# Patient Record
Sex: Male | Born: 1955
Health system: Southern US, Community
[De-identification: ages and names within clinical notes are randomized; demographics above are authoritative.]

## PROBLEM LIST (undated history)

## (undated) DIAGNOSIS — E119 Type 2 diabetes mellitus without complications: Secondary | ICD-10-CM

## (undated) DIAGNOSIS — K439 Ventral hernia without obstruction or gangrene: Secondary | ICD-10-CM

## (undated) DIAGNOSIS — I10 Essential (primary) hypertension: Secondary | ICD-10-CM

## (undated) DIAGNOSIS — N201 Calculus of ureter: Secondary | ICD-10-CM

## (undated) DIAGNOSIS — M1990 Unspecified osteoarthritis, unspecified site: Secondary | ICD-10-CM

## (undated) DIAGNOSIS — Z87442 Personal history of urinary calculi: Secondary | ICD-10-CM

## (undated) HISTORY — DX: Type 2 diabetes mellitus without complications: E11.9

## (undated) HISTORY — PX: OTHER SURGICAL HISTORY: SHX169

## (undated) HISTORY — PX: ANKLE FRACTURE SURGERY: SHX122

---

## 2009-12-14 ENCOUNTER — Ambulatory Visit (HOSPITAL_COMMUNITY): Admission: RE | Admit: 2009-12-14 | Discharge: 2009-12-14 | Payer: Self-pay | Admitting: Orthopaedic Surgery

## 2012-08-14 ENCOUNTER — Emergency Department (HOSPITAL_COMMUNITY): Payer: 59

## 2012-08-14 ENCOUNTER — Emergency Department (HOSPITAL_COMMUNITY)
Admission: EM | Admit: 2012-08-14 | Discharge: 2012-08-14 | Disposition: A | Discharge status: Home / Self Care | Payer: 59 | Attending: Emergency Medicine | Admitting: Emergency Medicine

## 2012-08-14 ENCOUNTER — Encounter (HOSPITAL_COMMUNITY): Payer: Self-pay | Admitting: *Deleted

## 2012-08-14 DIAGNOSIS — N201 Calculus of ureter: Secondary | ICD-10-CM | POA: Insufficient documentation

## 2012-08-14 DIAGNOSIS — R112 Nausea with vomiting, unspecified: Secondary | ICD-10-CM | POA: Insufficient documentation

## 2012-08-14 LAB — COMPREHENSIVE METABOLIC PANEL
AST: 32 U/L (ref 0–37)
Albumin: 5 g/dL (ref 3.5–5.2)
Creatinine, Ser: 1.34 mg/dL (ref 0.50–1.35)
GFR calc non Af Amer: 58 mL/min — ABNORMAL LOW (ref >90)
Total Bilirubin: 0.6 mg/dL (ref 0.3–1.2)

## 2012-08-14 LAB — URINALYSIS, ROUTINE W REFLEX MICROSCOPIC
Bilirubin Urine: NEGATIVE
Leukocytes, UA: NEGATIVE
Protein, ur: 30 mg/dL — AB

## 2012-08-14 LAB — CBC WITH DIFFERENTIAL/PLATELET
Eosinophils Absolute: 0 10*3/uL (ref 0.0–0.7)
Hemoglobin: 15.9 g/dL (ref 13.0–17.0)
MCH: 29.5 pg (ref 26.0–34.0)
MCV: 87 fL (ref 78.0–100.0)
Neutro Abs: 11.1 10*3/uL — ABNORMAL HIGH (ref 1.7–7.7)
RDW: 12.8 % (ref 11.5–15.5)

## 2012-08-14 MED ORDER — TAMSULOSIN HCL 0.4 MG PO CAPS: 0.4000 mg | ORAL_CAPSULE | Freq: Every day | ORAL | Status: DC

## 2012-08-14 MED ORDER — HYDROMORPHONE HCL PF 2 MG/ML IJ SOLN
2.0000 mg | Freq: Once | INTRAMUSCULAR | Status: AC
  Administered 2012-08-14: 2 mg via INTRAVENOUS
  Filled 2012-08-14: qty 1

## 2012-08-14 MED ORDER — ONDANSETRON HCL 4 MG/2ML IJ SOLN
4.0000 mg | Freq: Once | INTRAMUSCULAR | Status: AC
  Administered 2012-08-14: 4 mg via INTRAVENOUS
  Filled 2012-08-14: qty 2

## 2012-08-14 MED ORDER — SODIUM CHLORIDE 0.9 % IV SOLN
INTRAVENOUS | Status: DC
  Administered 2012-08-14: 13:00:00 via INTRAVENOUS

## 2012-08-14 MED ORDER — OXYCODONE-ACETAMINOPHEN 5-325 MG PO TABS: 1.0000 | ORAL_TABLET | ORAL | Status: AC | PRN

## 2012-08-14 MED ORDER — OXYCODONE-ACETAMINOPHEN 5-325 MG PO TABS: 1.0000 | ORAL_TABLET | ORAL | Status: DC | PRN

## 2012-08-14 NOTE — ED Notes (Signed)
Pain rt flank, nausea, with vomiting.  No diarrhea

## 2012-08-14 NOTE — ED Provider Notes (Addendum)
History     CSN: [id number]  Arrival date & time 08/14/12  1135   First MD Initiated Contact with Patient 08/14/12 1306      Chief Complaint  Patient presents with  . Flank Pain    (Consider location/radiation/quality/duration/timing/severity/associated sxs/prior treatment) Patient is a 57 y.o. male presenting with flank pain. The history is provided by the patient and the spouse. No language interpreter was used.  Flank Pain This is a new problem. The current episode started 3 to 5 hours ago. The problem occurs constantly. The problem has not changed since onset.Associated symptoms include abdominal pain. Pertinent negatives include no chest pain, no headaches and no shortness of breath. Nothing aggravates the symptoms. Nothing relieves the symptoms. He has tried nothing for the symptoms. Improvement on treatment: His father had kidney stones.    History reviewed. No pertinent past medical history.  Past Surgical History  Procedure Date  . Ankle fracture surgery     History reviewed. No pertinent family history.  History  Substance Use Topics  . Smoking status: Never Smoker   . Smokeless tobacco: Not on file  . Alcohol Use: No      Review of Systems  Constitutional: Negative.  Negative for fever and chills.  HENT: Negative.   Eyes: Negative.   Respiratory: Negative.  Negative for shortness of breath.   Cardiovascular: Negative for chest pain.  Gastrointestinal: Positive for nausea, vomiting and abdominal pain.  Genitourinary: Positive for flank pain.  Musculoskeletal: Negative.   Skin: Negative.   Neurological: Negative for headaches.  Psychiatric/Behavioral: Negative.     Allergies  Review of patient's allergies indicates no known allergies.  Home Medications   Current Outpatient Rx  Name  Route  Sig  Dispense  Refill  . NAPROXEN SODIUM 220 MG PO TABS   Oral   Take 220 mg by mouth 2 (two) times daily as needed. Pain           BP 168/107  Pulse 80   Temp 97.8 F (36.6 C) (Oral)  Resp 20  Ht 5\' 11"  (1.803 m)  Wt 245 lb (111.131 kg)  BMI 34.17 kg/m2  SpO2 99%  Physical Exam  Nursing note and vitals reviewed. Constitutional: He is oriented to person, place, and time.       In acute distress with right lower quadrant abdominal pain.  HENT:  Head: Normocephalic and atraumatic.  Right Ear: External ear normal.  Left Ear: External ear normal.  Mouth/Throat: Oropharynx is clear and moist.  Eyes: Conjunctivae normal and EOM are normal. Pupils are equal, round, and reactive to light. No scleral icterus.  Neck: Normal range of motion. Neck supple.  Cardiovascular: Normal rate, regular rhythm and normal heart sounds.   Pulmonary/Chest: Effort normal and breath sounds normal.  Abdominal: Soft. Bowel sounds are normal. He exhibits no distension. There is no tenderness.       He localizes pain to the right lower abdominal quadrant.  There is no mass or point tenderness.  Musculoskeletal: Normal range of motion. He exhibits no edema and no tenderness.  Neurological: He is alert and oriented to person, place, and time.       No sensory or motor deficit.  Skin: Skin is warm and dry.  Psychiatric: He has a normal mood and affect. His behavior is normal.    ED Course  Procedures (including critical care time)  Labs Reviewed  URINALYSIS, ROUTINE W REFLEX MICROSCOPIC - Abnormal; Notable for the following:    Specific  Gravity, Urine >1.030 (*)     Hgb urine dipstick LARGE (*)     Protein, ur 30 (*)     All other components within normal limits  URINE MICROSCOPIC-ADD ON  CBC WITH DIFFERENTIAL  COMPREHENSIVE METABOLIC PANEL   8:11 PM Pt seen --> physical exam performed.  Lab workup ordered.  IV fluids, IV medications for pain and nausea ordered.  3:31 PM Results for orders placed during the hospital encounter of 08/14/12  URINALYSIS, ROUTINE W REFLEX MICROSCOPIC      Component Value Range   Color, Urine YELLOW  YELLOW   APPearance  CLEAR  CLEAR   Specific Gravity, Urine >1.030 (*) 1.005 - 1.030   pH 5.5  5.0 - 8.0   Glucose, UA NEGATIVE  NEGATIVE mg/dL   Hgb urine dipstick LARGE (*) NEGATIVE   Bilirubin Urine NEGATIVE  NEGATIVE   Ketones, ur NEGATIVE  NEGATIVE mg/dL   Protein, ur 30 (*) NEGATIVE mg/dL   Urobilinogen, UA 0.2  0.0 - 1.0 mg/dL   Nitrite NEGATIVE  NEGATIVE   Leukocytes, UA NEGATIVE  NEGATIVE  URINE MICROSCOPIC-ADD ON      Component Value Range   RBC / HPF TOO NUMEROUS TO COUNT  <3 RBC/hpf  CBC WITH DIFFERENTIAL      Component Value Range   WBC 12.9 (*) 4.0 - 10.5 K/uL   RBC 5.39  4.22 - 5.81 MIL/uL   Hemoglobin 15.9  13.0 - 17.0 g/dL   HCT 91.4  78.2 - 95.6 %   MCV 87.0  78.0 - 100.0 fL   MCH 29.5  26.0 - 34.0 pg   MCHC 33.9  30.0 - 36.0 g/dL   RDW 21.3  08.6 - 57.8 %   Platelets 332  150 - 400 K/uL   Neutrophils Relative 86 (*) 43 - 77 %   Neutro Abs 11.1 (*) 1.7 - 7.7 K/uL   Lymphocytes Relative 9 (*) 12 - 46 %   Lymphs Abs 1.2  0.7 - 4.0 K/uL   Monocytes Relative 4  3 - 12 %   Monocytes Absolute 0.5  0.1 - 1.0 K/uL   Eosinophils Relative 0  0 - 5 %   Eosinophils Absolute 0.0  0.0 - 0.7 K/uL   Basophils Relative 0  0 - 1 %   Basophils Absolute 0.0  0.0 - 0.1 K/uL  COMPREHENSIVE METABOLIC PANEL      Component Value Range   Sodium 141  135 - 145 mEq/L   Potassium 3.4 (*) 3.5 - 5.1 mEq/L   Chloride 102  96 - 112 mEq/L   CO2 25  19 - 32 mEq/L   Glucose, Bld 148 (*) 70 - 99 mg/dL   BUN 31 (*) 6 - 23 mg/dL   Creatinine, Ser 4.69  0.50 - 1.35 mg/dL   Calcium 62.9  8.4 - 52.8 mg/dL   Total Protein 8.2  6.0 - 8.3 g/dL   Albumin 5.0  3.5 - 5.2 g/dL   AST 32  0 - 37 U/L   ALT 34  0 - 53 U/L   Alkaline Phosphatase 70  39 - 117 U/L   Total Bilirubin 0.6  0.3 - 1.2 mg/dL   GFR calc non Af Amer 58 (*) >90 mL/min   GFR calc Af Amer 67 (*) >90 mL/min   Ct Abdomen Pelvis Wo Contrast  08/14/2012  *RADIOLOGY REPORT*  Clinical Data:  Right-sided flank pain.  No history stones.  CT ABDOMEN AND  PELVIS WITHOUT CONTRAST (  CT UROGRAM)  Technique: Contiguous axial images of the abdomen and pelvis without oral or intravenous contrast were obtained.  Comparison: None  Findings:  Exam is limited for evaluation of entities other than urinary tract calculi due to lack of oral or intravenous contrast.   Lung bases:  Clear lung bases.  Normal heart size without pericardial or pleural effusion.  Calcification on image 8/series 2 may be part of the mitral annulus but is nonspecific.  Abdomen/pelvis:  Mild hepatic steatosis.  Normal spleen, stomach, pancreas, gallbladder, biliary tract, adrenal glands.  No left-sided renal calculi or hydronephrosis.  Mild to moderate right-sided urinary tract obstruction, which continues to the level of the 1 mm stone at the right ureterovesicular junction on image 82/series 2.  No retroperitoneal or retrocrural adenopathy.  Scattered colonic diverticula.  Normal terminal ileum and appendix. Normal small bowel without abdominal ascites.  Bilateral fat containing inguinal hernias. No pelvic adenopathy. Normal prostate, without significant free pelvic fluid.     Fat containing umbilical hernia is tiny.  Minimal adjacent fascial thickening, including on image 54/series 2.  Bones/Musculoskeletal:  Degenerative partial fusion of the right sacroiliac joint.  IMPRESSION: 1.  Mild to moderate right-sided urinary tract obstruction secondary to a 1 mm right ureterovesicular junction calculus. 2.  Minimal hepatic steatosis. 3.  Bilateral fat containing inguinal hernias.   Original Report Authenticated By: Jeronimo Greaves, M.D.     3:31 PM Rx Percocet for pain, Flomax for urinary antispasmodic, strain urine to catch the stone.  F/U with Dr. Jerre Simon; bring stone in to him.  No work for 3 days.  1. Right ureteral calculus       Carleene Cooper III, MD 08/14/12 1536    Carleene Cooper III, MD 08/14/12 [phone]

## 2014-04-03 ENCOUNTER — Emergency Department (HOSPITAL_COMMUNITY)
Admission: EM | Admit: 2014-04-03 | Discharge: 2014-04-03 | Disposition: A | Discharge status: Home / Self Care | Payer: 59 | Attending: Emergency Medicine | Admitting: Emergency Medicine

## 2014-04-03 ENCOUNTER — Emergency Department (HOSPITAL_COMMUNITY): Payer: 59

## 2014-04-03 ENCOUNTER — Encounter (HOSPITAL_COMMUNITY): Payer: Self-pay | Admitting: Emergency Medicine

## 2014-04-03 DIAGNOSIS — N200 Calculus of kidney: Secondary | ICD-10-CM | POA: Diagnosis not present

## 2014-04-03 DIAGNOSIS — R109 Unspecified abdominal pain: Secondary | ICD-10-CM | POA: Insufficient documentation

## 2014-04-03 LAB — BASIC METABOLIC PANEL
Anion gap: 10 (ref 5–15)
BUN: 15 mg/dL (ref 6–23)
CO2: 28 mEq/L (ref 19–32)
CREATININE: 1.14 mg/dL (ref 0.50–1.35)
Calcium: 9.5 mg/dL (ref 8.4–10.5)
Chloride: 100 mEq/L (ref 96–112)
GFR calc non Af Amer: 70 mL/min — ABNORMAL LOW (ref >90)
GFR, EST AFRICAN AMERICAN: 81 mL/min — AB (ref >90)
Glucose, Bld: 122 mg/dL — ABNORMAL HIGH (ref 70–99)
POTASSIUM: 3.9 meq/L (ref 3.7–5.3)
Sodium: 138 mEq/L (ref 137–147)

## 2014-04-03 LAB — CBC WITH DIFFERENTIAL/PLATELET
BASOS ABS: 0.1 10*3/uL (ref 0.0–0.1)
BASOS PCT: 0 % (ref 0–1)
Eosinophils Absolute: 0.1 10*3/uL (ref 0.0–0.7)
Eosinophils Relative: 1 % (ref 0–5)
HCT: 45.4 % (ref 39.0–52.0)
HEMOGLOBIN: 15.4 g/dL (ref 13.0–17.0)
Lymphocytes Relative: 15 % (ref 12–46)
Lymphs Abs: 1.8 10*3/uL (ref 0.7–4.0)
MCH: 29.4 pg (ref 26.0–34.0)
MCHC: 33.9 g/dL (ref 30.0–36.0)
MCV: 86.6 fL (ref 78.0–100.0)
Monocytes Absolute: 0.5 10*3/uL (ref 0.1–1.0)
Monocytes Relative: 4 % (ref 3–12)
NEUTROS PCT: 80 % — AB (ref 43–77)
Neutro Abs: 9.4 10*3/uL — ABNORMAL HIGH (ref 1.7–7.7)
Platelets: 296 10*3/uL (ref 150–400)
RBC: 5.24 MIL/uL (ref 4.22–5.81)
RDW: 12.6 % (ref 11.5–15.5)
WBC: 11.9 10*3/uL — ABNORMAL HIGH (ref 4.0–10.5)

## 2014-04-03 LAB — URINALYSIS, ROUTINE W REFLEX MICROSCOPIC
Bilirubin Urine: NEGATIVE
GLUCOSE, UA: NEGATIVE mg/dL
Ketones, ur: NEGATIVE mg/dL
LEUKOCYTES UA: NEGATIVE
NITRITE: NEGATIVE
PH: 5 (ref 5.0–8.0)
PROTEIN: 30 mg/dL — AB
Specific Gravity, Urine: 1.025 (ref 1.005–1.030)
UROBILINOGEN UA: 0.2 mg/dL (ref 0.0–1.0)

## 2014-04-03 LAB — URINE MICROSCOPIC-ADD ON

## 2014-04-03 MED ORDER — OXYCODONE-ACETAMINOPHEN 5-325 MG PO TABS: 1.0000 | ORAL_TABLET | ORAL | Status: DC | PRN

## 2014-04-03 MED ORDER — ONDANSETRON HCL 4 MG/2ML IJ SOLN
INTRAMUSCULAR | Status: AC
  Administered 2014-04-03: 4 mg via INTRAVENOUS
  Filled 2014-04-03: qty 2

## 2014-04-03 MED ORDER — HYDROMORPHONE HCL 1 MG/ML IJ SOLN
INTRAMUSCULAR | Status: AC
  Administered 2014-04-03: 1 mg via INTRAVENOUS
  Filled 2014-04-03: qty 1

## 2014-04-03 MED ORDER — KETOROLAC TROMETHAMINE 30 MG/ML IJ SOLN: 30.0000 mg | Freq: Once | INTRAMUSCULAR | Status: DC

## 2014-04-03 MED ORDER — ONDANSETRON HCL 4 MG/2ML IJ SOLN: 4.0000 mg | Freq: Once | INTRAMUSCULAR | Status: AC

## 2014-04-03 MED ORDER — TAMSULOSIN HCL 0.4 MG PO CAPS: 0.4000 mg | ORAL_CAPSULE | Freq: Every day | ORAL | Status: DC

## 2014-04-03 MED ORDER — KETOROLAC TROMETHAMINE 30 MG/ML IJ SOLN
30.0000 mg | Freq: Once | INTRAMUSCULAR | Status: AC
  Administered 2014-04-03: 30 mg via INTRAVENOUS

## 2014-04-03 MED ORDER — ONDANSETRON HCL 4 MG/2ML IJ SOLN
4.0000 mg | Freq: Once | INTRAMUSCULAR | Status: DC
  Administered 2014-04-03: 4 mg via INTRAVENOUS

## 2014-04-03 MED ORDER — HYDROMORPHONE HCL 1 MG/ML IJ SOLN
0.5000 mg | Freq: Once | INTRAMUSCULAR | Status: AC
  Administered 2014-04-03: 0.5 mg via INTRAVENOUS
  Filled 2014-04-03: qty 1

## 2014-04-03 MED ORDER — HYDROMORPHONE HCL 1 MG/ML IJ SOLN
1.0000 mg | Freq: Once | INTRAMUSCULAR | Status: DC
  Administered 2014-04-03: 1 mg via INTRAVENOUS

## 2014-04-03 MED ORDER — KETOROLAC TROMETHAMINE 30 MG/ML IJ SOLN
INTRAMUSCULAR | Status: AC
  Administered 2014-04-03: 30 mg via INTRAVENOUS
  Filled 2014-04-03: qty 1

## 2014-04-03 MED ORDER — PROMETHAZINE HCL 25 MG PO TABS: 25.0000 mg | ORAL_TABLET | Freq: Four times a day (QID) | ORAL | Status: DC | PRN

## 2014-04-03 MED ORDER — SODIUM CHLORIDE 0.9 % IV BOLUS (SEPSIS)
500.0000 mL | Freq: Once | INTRAVENOUS | Status: AC
  Administered 2014-04-03: 500 mL via INTRAVENOUS

## 2014-04-03 MED ORDER — HYDROMORPHONE HCL 1 MG/ML IJ SOLN: 1.0000 mg | Freq: Once | INTRAMUSCULAR | Status: AC

## 2014-04-03 NOTE — ED Notes (Addendum)
Pt states that he is having left sided flank pain. It started 2 days ago and pain has increased since. Pt states that he has had blood in his urine over the past 2 days.   Pt states he took oxycodone before coming but he vomited once he got to the ER.

## 2014-04-03 NOTE — ED Provider Notes (Signed)
CSN: [id number]     Arrival date & time 04/03/14  1347 History  This chart was scribed for Anthony Christen, MD by Evelene Croon, ED Scribe. This patient was seen in room APA09/[facility]09 and the patient's care was started 3:12 PM.    Chief Complaint  Patient presents with  . Flank Pain    The history is provided by the patient. No language interpreter was used.    HPI Comments:  TAYVIN PRESLAR is a 58 y.o. male with a history of 1 prior kidney stone, who presents to the Emergency Department complaining of moderate-severe  LLQ abdominal pain that radiates to his left flank and down to his left testicle that started this am. Pt also reports intractable nausea and vomiting for about 1 hour and hematuria for a few days. No alleviating factors noted. No fever or chills.   Past Medical History  Diagnosis Date  . Kidney stones    Past Surgical History  Procedure Laterality Date  . Ankle fracture surgery     History reviewed. No pertinent family history. History  Substance Use Topics  . Smoking status: Never Smoker   . Smokeless tobacco: Not on file  . Alcohol Use: No    Review of Systems At least 10pt or greater review of systems completed and are negative except where specified in the HPI.    Allergies  Review of patient's allergies indicates no known allergies.  Home Medications   Prior to Admission medications   Medication Sig Start Date End Date Taking? Authorizing Provider  oxyCODONE-acetaminophen (PERCOCET) 5-325 MG per tablet Take 1-2 tablets by mouth every 4 (four) hours as needed. 04/03/14   Anthony Christen, MD  promethazine (PHENERGAN) 25 MG tablet Take 1 tablet (25 mg total) by mouth every 6 (six) hours as needed. 04/03/14   Anthony Christen, MD  tamsulosin (FLOMAX) 0.4 MG CAPS capsule Take 1 capsule (0.4 mg total) by mouth daily. 04/03/14   Anthony Christen, MD   BP 141/86  Pulse 68  Temp(Src) 97.8 F (36.6 C) (Oral)  Resp 14  Ht 5\' 9"  (1.753 m)  Wt 250 lb (113.399 kg)  BMI 36.90  kg/m2  SpO2 97% Physical Exam  Nursing note and vitals reviewed. Constitutional: He is oriented to person, place, and time. He appears well-developed and well-nourished.  Pt in severe pain and vomiting  HENT:  Head: Normocephalic and atraumatic.  Eyes: Conjunctivae and EOM are normal. Pupils are equal, round, and reactive to light.  Neck: Normal range of motion. Neck supple.  Cardiovascular: Normal rate, regular rhythm and normal heart sounds.   Pulmonary/Chest: Effort normal and breath sounds normal.  Abdominal: There is tenderness.  LLQ tenderness  Left flank tenderness  Musculoskeletal: Normal range of motion.  Neurological: He is alert and oriented to person, place, and time.  Skin: Skin is warm and dry.  Psychiatric: He has a normal mood and affect. His behavior is normal.    ED Course  Procedures   DIAGNOSTIC STUDIES: Oxygen Saturation is 100% on RA, normal by my interpretation.    COORDINATION OF CARE: 3:17 PM Will treat his pain and order a CT. Discussed treatment plan with pt at bedside and pt agreed to plan.   Labs Review Labs Reviewed  CBC WITH DIFFERENTIAL - Abnormal; Notable for the following:    WBC 11.9 (*)    Neutrophils Relative % 80 (*)    Neutro Abs 9.4 (*)    All other components within normal limits  BASIC METABOLIC  PANEL - Abnormal; Notable for the following:    Glucose, Bld 122 (*)    GFR calc non Af Amer 70 (*)    GFR calc Af Amer 81 (*)    All other components within normal limits  URINALYSIS, ROUTINE W REFLEX MICROSCOPIC - Abnormal; Notable for the following:    Color, Urine AMBER (*)    APPearance CLOUDY (*)    Hgb urine dipstick LARGE (*)    Protein, ur 30 (*)    All other components within normal limits  URINE MICROSCOPIC-ADD ON    Imaging Review Ct Abdomen Pelvis Wo Contrast  04/03/2014   CLINICAL DATA:  Left flank pain.  History of pancreatitis.  EXAM: CT ABDOMEN AND PELVIS WITHOUT CONTRAST  TECHNIQUE: Multidetector CT imaging of  the abdomen and pelvis was performed following the standard protocol without IV contrast.  COMPARISON:  08/14/2012  FINDINGS: Lower chest: Minimal bibasilar atelectasis. Heart size is normal. There is metallic or calcific artifact in the region of the mitral valve.  Upper abdomen: No focal abnormality identified within the liver, spleen, pancreas, or adrenal glands.  There is left-sided hydronephrosis and hydroureter secondary to a calculus or calculi at the ureterovesical junction. Stone or stones measure approximately 6 mm in diameter. The right kidney and ureter have a normal appearance. The gallbladder is present.  Bowel: The stomach and small bowel loops are normal in appearance. The appendix is well seen and has a normal appearance. Numerous colonic diverticula are present. No acute diverticulitis.  Pelvis: The urinary bladder, urethra, prostate, and seminal vesicles have a normal appearance. No free pelvic fluid.  Retroperitoneum: No retroperitoneal or mesenteric adenopathy. No evidence for aortic aneurysm.  Abdominal wall: Small paraumbilical hernia is present. Adjacent to the umbilicus, there is a small soft tissue nodule which measures 2.7 x 1.7 cm. The appearance is stable compared with the prior study. Small bilateral fat containing inguinal hernias are noted, stable in appearance.  Osseous structures: There are degenerative changes in the lower thoracic and mid lumbar spine.  IMPRESSION: 1. Distal left ureteral stone or stones measuring approximately 6 mm in diameter. There is associated left hydronephrosis. 2. Diverticulosis. 3. Small paraumbilical hernia stable in appearance. Nodule adjacent to the para umbilical hernia also stable. 4. Small bilateral inguinal hernias containing mesenteric fat.   Electronically Signed   By: Shon Hale M.D.   On: 04/03/2014 17:07     EKG Interpretation None      MDM   Final diagnoses:  Kidney stone on left side    CT of abdomen and pelvis reveals a 6 mm  distal ureteral stone. Pain management. Referral to urology. Discharge medications Percocet, Phenergan 25 mg, Flomax 0.4 mg  I personally performed the services described in this documentation, which was scribed in my presence. The recorded information has been reviewed and is accurate.    Anthony Christen, MD 04/03/14 [phone]

## 2014-04-03 NOTE — Discharge Instructions (Signed)
Kidney Stones Kidney stones (urolithiasis) are solid masses that form inside your kidneys. The intense pain is caused by the stone moving through the kidney, ureter, bladder, and urethra (urinary tract). When the stone moves, the ureter starts to spasm around the stone. The stone is usually passed in your pee (urine).  HOME CARE  Drink enough fluids to keep your pee clear or pale yellow. This helps to get the stone out.  Strain all pee through the provided strainer. Do not pee without peeing through the strainer, not even once. If you pee the stone out, catch it in the strainer. The stone may be as small as a grain of salt. Take this to your doctor. This will help your doctor figure out what you can do to try to prevent more kidney stones.  Only take medicine as told by your doctor.  Follow up with your doctor as told.  Get follow-up X-rays as told by your doctor. GET HELP IF: You have pain that gets worse even if you have been taking pain medicine. GET HELP RIGHT AWAY IF:   Your pain does not get better with medicine.  You have a fever or shaking chills.  Your pain increases and gets worse over 18 hours.  You have new belly (abdominal) pain.  You feel faint or pass out.  You are unable to pee. MAKE SURE YOU:   Understand these instructions.  Will watch your condition.  Will get help right away if you are not doing well or get worse. Document Released: 12/12/2007 Document Revised: 02/25/2013 Document Reviewed: 11/26/2012 [facility] Patient Information 2015 [place], [place]. This information is not intended to replace advice given to you by your health care provider. Make sure you discuss any questions you have with your health care provider.  You have a 6 mm kidney stone. Medication for pain, nausea, and to increase urinary flow. Call urology office on Monday morning for an appointment next week. Return if pain worsens.

## 2014-04-07 ENCOUNTER — Other Ambulatory Visit: Payer: Self-pay | Admitting: Urology

## 2014-04-12 ENCOUNTER — Encounter (HOSPITAL_BASED_OUTPATIENT_CLINIC_OR_DEPARTMENT_OTHER): Payer: Self-pay | Admitting: *Deleted

## 2014-04-14 ENCOUNTER — Encounter (HOSPITAL_BASED_OUTPATIENT_CLINIC_OR_DEPARTMENT_OTHER): Payer: Self-pay | Admitting: *Deleted

## 2014-04-14 NOTE — Progress Notes (Signed)
Pt instructed npo p mn10/11.  To [place] 10/12 @ 0900.  Ok to take pain med w sip of water if needed.  Labs w/ chart

## 2014-04-19 ENCOUNTER — Ambulatory Visit (HOSPITAL_BASED_OUTPATIENT_CLINIC_OR_DEPARTMENT_OTHER)
Admission: RE | Admit: 2014-04-19 | Discharge: 2014-04-19 | Disposition: A | Discharge status: Home / Self Care | Payer: 59 | Source: Ambulatory Visit | Attending: Urology | Admitting: Urology

## 2014-04-19 ENCOUNTER — Ambulatory Visit (HOSPITAL_BASED_OUTPATIENT_CLINIC_OR_DEPARTMENT_OTHER): Payer: 59 | Admitting: Anesthesiology

## 2014-04-19 ENCOUNTER — Encounter (HOSPITAL_BASED_OUTPATIENT_CLINIC_OR_DEPARTMENT_OTHER): Payer: Self-pay

## 2014-04-19 ENCOUNTER — Encounter (HOSPITAL_BASED_OUTPATIENT_CLINIC_OR_DEPARTMENT_OTHER): Payer: 59 | Admitting: Anesthesiology

## 2014-04-19 ENCOUNTER — Encounter (HOSPITAL_BASED_OUTPATIENT_CLINIC_OR_DEPARTMENT_OTHER)
Admission: RE | Disposition: A | Discharge status: Home / Self Care | Payer: Self-pay | Source: Ambulatory Visit | Attending: Urology

## 2014-04-19 DIAGNOSIS — N201 Calculus of ureter: Secondary | ICD-10-CM | POA: Insufficient documentation

## 2014-04-19 DIAGNOSIS — Z6836 Body mass index (BMI) 36.0-36.9, adult: Secondary | ICD-10-CM | POA: Diagnosis not present

## 2014-04-19 DIAGNOSIS — N131 Hydronephrosis with ureteral stricture, not elsewhere classified: Secondary | ICD-10-CM | POA: Diagnosis not present

## 2014-04-19 HISTORY — PX: CYSTOSCOPY W/ RETROGRADES: SHX1426

## 2014-04-19 HISTORY — DX: Calculus of ureter: N20.1

## 2014-04-19 HISTORY — PX: CYSTOSCOPY WITH URETEROSCOPY: SHX5123

## 2014-04-19 HISTORY — PX: CYSTOSCOPY WITH STENT PLACEMENT: SHX5790

## 2014-04-19 SURGERY — CYSTOSCOPY WITH URETEROSCOPY
Anesthesia: General | Site: Ureter | Laterality: Left

## 2014-04-19 MED ORDER — SODIUM CHLORIDE 0.9 % IR SOLN
Status: DC | PRN
  Administered 2014-04-19: 4000 mL

## 2014-04-19 MED ORDER — ACETAMINOPHEN 10 MG/ML IV SOLN
INTRAVENOUS | Status: DC | PRN
  Administered 2014-04-19: 1000 mg via INTRAVENOUS

## 2014-04-19 MED ORDER — FENTANYL CITRATE 0.05 MG/ML IJ SOLN
INTRAMUSCULAR | Status: DC | PRN
  Administered 2014-04-19 (×4): 50 ug via INTRAVENOUS

## 2014-04-19 MED ORDER — CEFAZOLIN SODIUM-DEXTROSE 2-3 GM-% IV SOLR
2.0000 g | INTRAVENOUS | Status: AC
  Administered 2014-04-19: 2 g via INTRAVENOUS
  Filled 2014-04-19: qty 50

## 2014-04-19 MED ORDER — PROPOFOL 10 MG/ML IV BOLUS
INTRAVENOUS | Status: DC | PRN
  Administered 2014-04-19: 20 mg via INTRAVENOUS
  Administered 2014-04-19: 250 mg via INTRAVENOUS

## 2014-04-19 MED ORDER — KETOROLAC TROMETHAMINE 30 MG/ML IJ SOLN
15.0000 mg | Freq: Once | INTRAMUSCULAR | Status: DC | PRN
  Filled 2014-04-19: qty 1

## 2014-04-19 MED ORDER — PHENAZOPYRIDINE HCL 200 MG PO TABS: 200.0000 mg | ORAL_TABLET | Freq: Three times a day (TID) | ORAL | Status: DC | PRN

## 2014-04-19 MED ORDER — TRAMADOL-ACETAMINOPHEN 37.5-325 MG PO TABS: 1.0000 | ORAL_TABLET | Freq: Four times a day (QID) | ORAL | Status: DC | PRN

## 2014-04-19 MED ORDER — KETOROLAC TROMETHAMINE 30 MG/ML IJ SOLN
INTRAMUSCULAR | Status: DC | PRN
  Administered 2014-04-19: 30 mg via INTRAVENOUS

## 2014-04-19 MED ORDER — TRIMETHOPRIM 100 MG PO TABS: 100.0000 mg | ORAL_TABLET | ORAL | Status: DC

## 2014-04-19 MED ORDER — FENTANYL CITRATE 0.05 MG/ML IJ SOLN
INTRAMUSCULAR | Status: AC
  Filled 2014-04-19: qty 4

## 2014-04-19 MED ORDER — BELLADONNA ALKALOIDS-OPIUM 16.2-60 MG RE SUPP
RECTAL | Status: DC | PRN
  Administered 2014-04-19: 1 via RECTAL

## 2014-04-19 MED ORDER — LACTATED RINGERS IV SOLN
INTRAVENOUS | Status: DC
  Administered 2014-04-19: 10:00:00 via INTRAVENOUS
  Filled 2014-04-19: qty 1000

## 2014-04-19 MED ORDER — MIDAZOLAM HCL 5 MG/5ML IJ SOLN
INTRAMUSCULAR | Status: DC | PRN
  Administered 2014-04-19: 2 mg via INTRAVENOUS

## 2014-04-19 MED ORDER — IOHEXOL 350 MG/ML SOLN
INTRAVENOUS | Status: DC | PRN
  Administered 2014-04-19: 6 mL

## 2014-04-19 MED ORDER — ONDANSETRON HCL 4 MG/2ML IJ SOLN
INTRAMUSCULAR | Status: DC | PRN
  Administered 2014-04-19: 4 mg via INTRAVENOUS

## 2014-04-19 MED ORDER — METOCLOPRAMIDE HCL 5 MG/ML IJ SOLN
INTRAMUSCULAR | Status: DC | PRN
  Administered 2014-04-19: 10 mg via INTRAVENOUS

## 2014-04-19 MED ORDER — DEXAMETHASONE SODIUM PHOSPHATE 4 MG/ML IJ SOLN
INTRAMUSCULAR | Status: DC | PRN
  Administered 2014-04-19: 10 mg via INTRAVENOUS

## 2014-04-19 MED ORDER — MIDAZOLAM HCL 2 MG/2ML IJ SOLN
INTRAMUSCULAR | Status: AC
  Filled 2014-04-19: qty 2

## 2014-04-19 MED ORDER — FENTANYL CITRATE 0.05 MG/ML IJ SOLN
25.0000 ug | INTRAMUSCULAR | Status: DC | PRN
  Filled 2014-04-19: qty 1

## 2014-04-19 MED ORDER — LIDOCAINE HCL (CARDIAC) 20 MG/ML IV SOLN
INTRAVENOUS | Status: DC | PRN
  Administered 2014-04-19: 100 mg via INTRAVENOUS

## 2014-04-19 MED ORDER — BELLADONNA ALKALOIDS-OPIUM 16.2-60 MG RE SUPP
RECTAL | Status: AC
  Filled 2014-04-19: qty 1

## 2014-04-19 MED ORDER — PROMETHAZINE HCL 25 MG/ML IJ SOLN
6.2500 mg | INTRAMUSCULAR | Status: DC | PRN
  Filled 2014-04-19: qty 1

## 2014-04-19 MED ORDER — CEFAZOLIN SODIUM-DEXTROSE 2-3 GM-% IV SOLR
INTRAVENOUS | Status: AC
  Filled 2014-04-19: qty 50

## 2014-04-19 SURGICAL SUPPLY — 41 items
ADAPTER CATH URET PLST 4-6FR (CATHETERS) IMPLANT
ADPR CATH URET STRL DISP 4-6FR (CATHETERS)
BAG DRAIN URO-CYSTO SKYTR STRL (DRAIN) ×5 IMPLANT
BAG DRN UROCATH (DRAIN) ×3
BASKET LASER NITINOL 1.9FR (BASKET) IMPLANT
BASKET STNLS GEMINI 4WIRE 3FR (BASKET) IMPLANT
BASKET ZERO TIP NITINOL 2.4FR (BASKET) ×4 IMPLANT
BOOTIES KNEE HIGH SLOAN (MISCELLANEOUS) ×5 IMPLANT
BSKT STON RTRVL 120 1.9FR (BASKET)
BSKT STON RTRVL GEM 120X11 3FR (BASKET)
BSKT STON RTRVL ZERO TP 2.4FR (BASKET) ×3
CANISTER SUCT LVC 12 LTR MEDI- (MISCELLANEOUS) ×4 IMPLANT
CATH CLEAR GEL 3F BACKSTOP (CATHETERS) IMPLANT
CATH INTERMIT  6FR 70CM (CATHETERS) ×4 IMPLANT
CATH URET 5FR 28IN CONE TIP (BALLOONS)
CATH URET 5FR 28IN OPEN ENDED (CATHETERS) IMPLANT
CATH URET 5FR 70CM CONE TIP (BALLOONS) IMPLANT
CATH URET DUAL LUMEN 6-10FR 50 (CATHETERS) IMPLANT
CLOTH BEACON ORANGE TIMEOUT ST (SAFETY) ×5 IMPLANT
DRAPE CAMERA CLOSED 9X96 (DRAPES) ×5 IMPLANT
ELECT REM PT RETURN 9FT ADLT (ELECTROSURGICAL)
ELECTRODE REM PT RTRN 9FT ADLT (ELECTROSURGICAL) IMPLANT
GLOVE BIO SURGEON STRL SZ7 (GLOVE) ×5 IMPLANT
GLOVE BIOGEL M 6.5 STRL (GLOVE) ×4 IMPLANT
GLOVE BIOGEL PI IND STRL 6.5 (GLOVE) ×4 IMPLANT
GLOVE BIOGEL PI INDICATOR 6.5 (GLOVE) ×4
GOWN PREVENTION PLUS LG XLONG (DISPOSABLE) ×5 IMPLANT
GOWN STRL REIN XL XLG (GOWN DISPOSABLE) ×5 IMPLANT
GOWN STRL REUS W/TWL XL LVL3 (GOWN DISPOSABLE) ×4 IMPLANT
GUIDEWIRE 0.038 PTFE COATED (WIRE) IMPLANT
GUIDEWIRE ANG ZIPWIRE 038X150 (WIRE) IMPLANT
GUIDEWIRE STR DUAL SENSOR (WIRE) ×4 IMPLANT
IV NS IRRIG 3000ML ARTHROMATIC (IV SOLUTION) ×6 IMPLANT
KIT BALLIN UROMAX 15FX10 (LABEL) IMPLANT
KIT BALLN UROMAX 15FX4 (MISCELLANEOUS) IMPLANT
KIT BALLN UROMAX 26 75X4 (MISCELLANEOUS)
SET HIGH PRES BAL DIL (LABEL)
SHEATH ACCESS URETERAL 38CM (SHEATH) IMPLANT
SHEATH ACCESS URETERAL 54CM (SHEATH) IMPLANT
STENT URET 6FRX24 CONTOUR (STENTS) ×4 IMPLANT
SYRINGE IRR TOOMEY STRL 70CC (SYRINGE) ×4 IMPLANT

## 2014-04-19 NOTE — Op Note (Signed)
Pre-operative diagnosis : Impacted left ureterovesical junction stone, 6 mm  Postoperative diagnosis:  Same  Operation:  Cystourethroscopy, left retrograde PolyGram interpretation, left ureteroscopy, fragmentation of left lower ureteral calculus and basket extraction of lower ureter calculus, passage of 6 French by 24 cm contour left double-J stent (no suture).  Surgeon:  Chauncey Cruel. Gaynelle Arabian, MD  First assistant:  None  Anesthesia:  General LMA  Preparation:  After appropriate preanesthesia, the patient was brought to the upper, placed on the upper table in dorsal supine position where general LMA anesthesia was induced. He was then replaced in the dorsal lithotomy position with pubis was prepped with Betadine solution and draped in usual fashion. His left arm was clearly marked, and visible for the duration of the procedure.  Review history:  History of Present Illness  58 yo male referred by the ER for further evaluation of a kidney stone. He first noted gross painless hematuria 7 days ago, intermittant for 2-3 days, thought 2ndary infection. He developed L flank pain Saturday, and was seen in [facility], [place], with nausea and vomiting. No fever , but pt had chills. He has not passed the stone that he is aware of. Patient was seen in the ER on 04/03/14 for LLQ abdominal pain that was radiating to the Lt flank area & Lt testicle. CT scan showed a 56mm Lt distal ureteral stone with hydronephrosis. Sodas: Diet Sun Drop: 4/day ( approx 1L/day). Tobacco : none. Job: A/C helper.      Statement of  Likelihood of Success: Excellent. TIME-OUT observed.:  Procedure:  Cystourethroscopy was accomplished and showed normal appearing urethra, and prostate. The bladder base was normal. Left retrograde PolyGram was performed, and showed very tight ureterovesical junction, and under magnification, stone was identified with minimal calcification.  Ureteroscopy was then accomplished, which showed the stone,  which broke into several pieces during ureteroscopy. The ureter was noted to be edematous and inflamed. Using a 4 wire flat basket, the pieces of stone were removed. I elected to pass a 24 cm, 6 French double-J stent, and this was passed over the guidewire, into the renal pelvis, under fluoroscopic control. The suture was removed. The patient tolerated she well. Is given IV Tylenol and IV Toradol prior to awakening. Is covered with IV antibiotics for the procedure. He was awakened, and taken to recovery room in good condition. B. and O. suppository was placed at the beginning of the procedure.

## 2014-04-19 NOTE — Anesthesia Procedure Notes (Signed)
Procedure Name: LMA Insertion Date/Time: 04/19/2014 10:34 AM Performed by: Mechele Claude Pre-anesthesia Checklist: Patient identified, Emergency Drugs available, Suction available and Patient being monitored Patient Re-evaluated:Patient Re-evaluated prior to inductionOxygen Delivery Method: Circle System Utilized Preoxygenation: Pre-oxygenation with 100% oxygen Intubation Type: IV induction Ventilation: Mask ventilation without difficulty LMA: LMA inserted LMA Size: 5.0 Number of attempts: 1 Airway Equipment and Method: bite block Placement Confirmation: positive ETCO2 Tube secured with: Tape Dental Injury: Teeth and Oropharynx as per pre-operative assessment

## 2014-04-19 NOTE — Anesthesia Postprocedure Evaluation (Signed)
  Anesthesia Post-op Note  Patient: Anthony Frederick  Procedure(s) Performed: Procedure(s) (LRB): CYSTOSCOPY WITH URETEROSCOPY/ BASKET EXTRACTION (Left) CYSTOSCOPY WITH RETROGRADE PYELOGRAM/ INTERPRETATION (Left) CYSTOSCOPY WITH STENT PLACEMENT (Left)  Patient Location: PACU  Anesthesia Type: General  Level of Consciousness: awake and alert   Airway and Oxygen Therapy: Patient Spontanous Breathing  Post-op Pain: mild  Post-op Assessment: Post-op Vital signs reviewed, Patient's Cardiovascular Status Stable, Respiratory Function Stable, Patent Airway and No signs of Nausea or vomiting  Last Vitals:  Filed Vitals:   04/19/14 1127  BP:   Pulse:   Temp:   Resp: 14    Post-op Vital Signs: stable   Complications: No apparent anesthesia complications

## 2014-04-19 NOTE — H&P (Signed)
Reason For Visit Kidney stone   History of Present Illness 58 yo male referred by the ER for further evaluation of a kidney stone. He first noted gross painless hematuria 7 days ago, intermittant for 2-3 days, thought 2ndary infection. He developed L flank pain Saturday, and was seen in [facility], [place], with nausea and vomiting. No fever , but pt had chills.  He has not passed the stone that he is aware of. Patient was seen in the ER on 04/03/14 for LLQ abdominal pain that was radiating to the Lt flank area & Lt testicle. CT scan showed a 62mm Lt distal ureteral stone with hydronephrosis. [place]: Diet Sun Drop: 4/day ( approx 1L/day). Tobacco : none. Job: A/C helper.   Past Medical History Problems  1. History of renal calculi (V13.01)  Surgical History Problems  1. History of Ankle Surgery  Current Meds 1. Oxycodone-Acetaminophen 5-325 MG Oral Tablet;  Therapy: (Recorded:29Sep2015) to Recorded 2. Promethazine HCl - 25 MG Oral Tablet;  Therapy: (Recorded:29Sep2015) to Recorded 3. Tamsulosin HCl - 0.4 MG Oral Capsule;  Therapy: (Recorded:29Sep2015) to Recorded  Allergies Medication  1. No Known Drug Allergies  Family History Problems  1. Family history of Deceased : Mother, Father 2. Family history of malignant neoplasm (V16.9) : Father  Social History Problems    Married   Never a smoker   No alcohol use   No caffeine use   Number of children   Occupation  Review of Systems Genitourinary, constitutional, skin, eye, otolaryngeal, hematologic/lymphatic, cardiovascular, pulmonary, endocrine, musculoskeletal, gastrointestinal, neurological and psychiatric system(s) were reviewed and pertinent findings if present are noted.  Genitourinary: urinary frequency, nocturia and hematuria, but no feelings of urinary urgency, no incontinence, no difficulty starting the urinary stream, urine stream is not weak, urinary stream does not start and stop, no incomplete emptying of  bladder, no post-void dribbling and initiating urination does not require straining.  Gastrointestinal: nausea, vomiting and flank pain.    Vitals Vital Signs [Data Includes: Last 1 Day]  Recorded: 29Sep2015 12:16PM  Height: 5 ft 9 in Weight: 250 lb  BMI Calculated: 36.92 BSA Calculated: 2.27 Blood Pressure: 172 / 101 Heart Rate: 71  Physical Exam Constitutional: Well nourished and well developed . No acute distress.  ENT:. The ears and nose are normal in appearance.  Neck: The appearance of the neck is normal and no neck mass is present.  Pulmonary: No respiratory distress and normal respiratory rhythm and effort.  Cardiovascular: Heart rate and rhythm are normal . No peripheral edema.  Abdomen: The abdomen is rounded. The abdomen is soft and nontender. No masses are palpated. No CVA tenderness. No hernias are palpable. No hepatosplenomegaly noted.  Rectal: Rectal exam demonstrates normal sphincter tone, no tenderness and no masses. The prostate has no nodularity and is not tender. The left seminal vesicle is nonpalpable. The right seminal vesicle is nonpalpable. The perineum is normal on inspection.  Genitourinary: Examination of the penis demonstrates no discharge, no masses, no lesions and a normal meatus. The scrotum is without lesions. The right epididymis is palpably normal and non-tender. The left epididymis is palpably normal and non-tender. The right testis is non-tender and without masses. The left testis is non-tender and without masses.  Lymphatics: The femoral and inguinal nodes are not enlarged or tender.  Skin: Normal skin turgor, no visible rash and no visible skin lesions.  Neuro/Psych:. Mood and affect are appropriate.    Results/Data  06 Apr 2014 12:05 PM  UA With REFLEX  COLOR YELLOW     APPEARANCE CLEAR     SPECIFIC GRAVITY 1.010     pH 7.0     GLUCOSE NEG     BILIRUBIN NEG     KETONE NEG     BLOOD MOD     PROTEIN NEG     UROBILINOGEN 0.2     NITRITE NEG      LEUKOCYTE ESTERASE NEG     SQUAMOUS EPITHELIAL/HPF NONE SEEN     WBC 0-2     CRYSTALS NONE SEEN     CASTS NONE SEEN     RBC 3-6     BACTERIA NONE SEEN     Assessment Assessed  1. Left ureteral stone (592.1) 2. Gross hematuria (599.71)  58 yo male with 66mm L U-V junction stone He was given Flomax at [facility], and meds for nausea and pain. He was not given strainer. WE have reviewed his CT from [place]. He will be placed on the OR schedule and may cancel if he passed his stone. He may take his flomax, and his pain med.   Plan Schedule stone removal.   Strain urine. Capture for analysis. Continue Flomax.   Signatures Electronically signed by : Carolan Clines, M.D.; Apr 06 2014 12:56PM EST

## 2014-04-19 NOTE — Transfer of Care (Addendum)
Immediate Anesthesia Transfer of Care Note  Patient: RAFORD BRISSETT  Procedure(s) Performed: Procedure(s) (LRB): CYSTOSCOPY WITH URETEROSCOPY/ BASKET EXTRACTION (Left) CYSTOSCOPY WITH RETROGRADE PYELOGRAM/ INTERPERTATION (Left) CYSTOSCOPY WITH STENT PLACEMENT (Left)  Patient Location: PACU  Anesthesia Type: General  Level of Consciousness: sleepy  Airway & Oxygen Therapy: Patient Spontanous Breathing and Patient connected to face mask oxygen, oral airway remaining  Post-op Assessment: Report given to PACU RN and Post -op Vital signs reviewed and stable  Post vital signs: Reviewed and stable  Complications: No apparent anesthesia complications

## 2014-04-19 NOTE — Interval H&P Note (Signed)
History and Physical Interval Note:  04/19/2014 10:15 AM  Anthony Frederick  has presented today for surgery, with the diagnosis of LEFT URETEROVESICAL JUNCTION (UVJ) STONE  The various methods of treatment have been discussed with the patient and family. After consideration of risks, benefits and other options for treatment, the patient has consented to  Procedure(s): CYSTO WITH LEFT RETROGRADE PYELOGRAM, URETEROSCOPY/BASKET EXTRACTION/POSSIBLE JJ STENT PLACEMENT (Left) LASER OF STONE (N/A) as a surgical intervention .  The patient's history has been reviewed, patient examined, no change in status, stable for surgery.  I have reviewed the patient's chart and labs.  Questions were answered to the patient's satisfaction.     Carolan Clines I

## 2014-04-19 NOTE — Anesthesia Preprocedure Evaluation (Addendum)
Anesthesia Evaluation  Patient identified by MRN, date of birth, ID band Patient awake    Reviewed: Allergy & Precautions, H&P , NPO status , Patient's Chart, lab work & pertinent test results  Airway Mallampati: II TM Distance: >3 FB Neck ROM: Full    Dental no notable dental hx.    Pulmonary neg pulmonary ROS,  breath sounds clear to auscultation  Pulmonary exam normal       Cardiovascular negative cardio ROS  Rhythm:Regular Rate:Normal     Neuro/Psych negative neurological ROS  negative psych ROS   GI/Hepatic negative GI ROS, Neg liver ROS,   Endo/Other  Morbid obesity  Renal/GU negative Renal ROS  negative genitourinary   Musculoskeletal negative musculoskeletal ROS (+)   Abdominal   Peds negative pediatric ROS (+)  Hematology negative hematology ROS (+)   Anesthesia Other Findings   Reproductive/Obstetrics negative OB ROS                          Anesthesia Physical Anesthesia Plan  ASA: II  Anesthesia Plan: General   Post-op Pain Management:    Induction: Intravenous  Airway Management Planned: LMA  Additional Equipment:   Intra-op Plan:   Post-operative Plan: Extubation in OR  Informed Consent: I have reviewed the patients History and Physical, chart, labs and discussed the procedure including the risks, benefits and alternatives for the proposed anesthesia with the patient or authorized representative who has indicated his/her understanding and acceptance.   Dental advisory given  Plan Discussed with: CRNA and Surgeon  Anesthesia Plan Comments:         Anesthesia Quick Evaluation

## 2014-04-19 NOTE — Discharge Instructions (Addendum)
Kidney Stones °Kidney stones (urolithiasis) are solid masses that form inside your kidneys. The intense pain is caused by the stone moving through the kidney, ureter, bladder, and urethra (urinary tract). When the stone moves, the ureter starts to spasm around the stone. The stone is usually passed in your pee (urine).  °HOME CARE °· Drink enough fluids to keep your pee clear or pale yellow. This helps to get the stone out. °· Strain all pee through the provided strainer. Do not pee without peeing through the strainer, not even once. If you pee the stone out, catch it in the strainer. The stone may be as small as a grain of salt. Take this to your doctor. This will help your doctor figure out what you can do to try to prevent more kidney stones. °· Only take medicine as told by your doctor. °· Follow up with your doctor as told. °· Get follow-up X-rays as told by your doctor. °GET HELP IF: °You have pain that gets worse even if you have been taking pain medicine. °GET HELP RIGHT AWAY IF:  °· Your pain does not get better with medicine. °· You have a fever or shaking chills. °· Your pain increases and gets worse over 18 hours. °· You have new belly (abdominal) pain. °· You feel faint or pass out. °· You are unable to pee. °MAKE SURE YOU:  °· Understand these instructions. °· Will watch your condition. °· Will get help right away if you are not doing well or get worse. °Document Released: 12/12/2007 Document Revised: 02/25/2013 Document Reviewed: 11/26/2012 °ExitCare® Patient Information ©2015 [employer]. This information is not intended to replace advice given to you by your health care provider. Make sure you discuss any questions you have with your health care provider. ° °Dietary Guidelines to Help Prevent Kidney Stones °Your risk of kidney stones can be decreased by adjusting the foods you eat. The most important thing you can do is drink enough fluid. You should drink enough fluid to keep your urine clear or  pale yellow. The following guidelines provide specific information for the type of kidney stone you have had. °GUIDELINES ACCORDING TO TYPE OF KIDNEY STONE °Calcium Oxalate Kidney Stones °· Reduce the amount of salt you eat. Foods that have a lot of salt cause your body to release excess calcium into your urine. The excess calcium can combine with a substance called oxalate to form kidney stones. °· Reduce the amount of animal protein you eat if the amount you eat is excessive. Animal protein causes your body to release excess calcium into your urine. Ask your dietitian how much protein from animal sources you should be eating. °· Avoid foods that are high in oxalates. If you take vitamins, they should have less than 500 mg of vitamin C. Your body turns vitamin C into oxalates. You do not need to avoid fruits and vegetables high in vitamin C. °Calcium Phosphate Kidney Stones °· Reduce the amount of salt you eat to help prevent the release of excess calcium into your urine. °· Reduce the amount of animal protein you eat if the amount you eat is excessive. Animal protein causes your body to release excess calcium into your urine. Ask your dietitian how much protein from animal sources you should be eating. °· Get enough calcium from food or take a calcium supplement (ask your dietitian for recommendations). Food sources of calcium that do not increase your risk of kidney stones include: °¨ Broccoli. °¨ Dairy products,   such as cheese and yogurt.  Pudding. Uric Acid Kidney Stones  Do not have more than 6 oz of animal protein per day. FOOD SOURCES Animal Protein Sources  Meat (all types).  Poultry.  Eggs.  Fish, seafood. Foods High in [employer] seasonings.  Soy sauce.  Teriyaki sauce.  Cured and processed meats.  Salted crackers and snack foods.  Fast food.  Canned soups and most canned foods. Foods High in Oxalates  Grains:  Amaranth.  Barley.  Grits.  Wheat  germ.  Bran.  Buckwheat flour.  All bran cereals.  Pretzels.  Whole wheat bread.  Vegetables:  Beans (wax).  Beets and beet greens.  Collard greens.  Eggplant.  [place]scarole.  Leeks.  Okra.  Parsley.  Rutabagas.  Spinach.  Swiss chard.  Tomato paste.  Fried potatoes.  Sweet potatoes.  Fruits:  Red currants.  Figs.  Kiwi.  Rhubarb.  Meat and Other Protein Sources:  Beans (dried).  Soy burgers and other soybean products.  Miso.  Nuts (peanuts, almonds, pecans, cashews, hazelnuts).  Nut butters.  Sesame seeds and tahini (paste made of sesame seeds).  Poppy seeds.  Beverages:  Chocolate drink mixes.  Soy milk.  Instant iced tea.  Juices made from high-oxalate fruits or vegetables.  Other:  Carob.  Chocolate.  Fruitcake.  Marmalades. Document Released: 10/20/2010 Document Revised: 06/30/2013 Document Reviewed: 05/22/2013 [facility] Patient Information 2015 [place], [place]. This information is not intended to replace advice given to you by your health care provider. Make sure you discuss any questions you have with your health care provider.  Ureteral Stent Implantation, Care After Refer to this sheet in the next few weeks. These instructions provide you with information on caring for yourself after your procedure. Your health care provider may also give you more specific instructions. Your treatment has been planned according to current medical practices, but problems sometimes occur. Call your health care provider if you have any problems or questions after your procedure. WHAT TO EXPECT AFTER THE PROCEDURE You should be back to normal activity within 48 hours after the procedure. Nausea and vomiting may occur and are commonly the result of anesthesia. It is common to experience sharp pain in the back or lower abdomen and penis with voiding. This is caused by movement of the ends of the stent with the act of urinating.It usually  goes away within minutes after you have stopped urinating. HOME CARE INSTRUCTIONS Make sure to drink plenty of fluids. You may have small amounts of bleeding, causing your urine to be red. This is normal. Certain movements may trigger pain or a feeling that you need to urinate. You may be given medicines to prevent infection or bladder spasms. Be sure to take all medicines as directed. Only take over-the-counter or prescription medicines for pain, discomfort, or fever as directed by your health care provider. Do not take aspirin, as this can make bleeding worse. Your stent will be left in until the blockage is resolved. This may take 2 weeks or longer, depending on the reason for stent implantation. You may have an X-ray exam to make sure your ureter is open and that the stent has not moved out of position (migrated). The stent can be removed by your health care provider in the office. Medicines may be given for comfort while the stent is being removed. Be sure to keep all follow-up appointments so your health care provider can check that you are healing properly. SEEK MEDICAL CARE IF:  You experience  increasing pain.  Your pain medicine is not working. SEEK IMMEDIATE MEDICAL CARE IF:  Your urine is dark red or has blood clots.  You are leaking urine (incontinent).  You have a fever, chills, feeling sick to your stomach (nausea), or vomiting.  Your pain is not relieved by pain medicine.  The end of the stent comes out of the urethra.  You are unable to urinate. Document Released: 02/25/2013 Document Revised: 06/30/2013 Document Reviewed: 02/25/2013 [facility] Patient Information 2015 [place], [place]. This information is not intended to replace advice given to you by your health care provider. Make sure you discuss any questions you have with your health care provider.    Post Anesthesia Home Care Instructions  Activity: Get plenty of rest for the remainder of the day. A responsible adult  should stay with you for 24 hours following the procedure.  For the next 24 hours, DO NOT: -Drive a car -Paediatric nurse -Drink alcoholic beverages -Take any medication unless instructed by your physician -Make any legal decisions or sign important papers.  Meals: Start with liquid foods such as gelatin or soup. Progress to regular foods as tolerated. Avoid greasy, spicy, heavy foods. If nausea and/or vomiting occur, drink only clear liquids until the nausea and/or vomiting subsides. Call your physician if vomiting continues.  Special Instructions/Symptoms: Your throat may feel dry or sore from the anesthesia or the breathing tube placed in your throat during surgery. If this causes discomfort, gargle with warm salt water. The discomfort should disappear within 24 hours.

## 2014-04-20 ENCOUNTER — Encounter (HOSPITAL_BASED_OUTPATIENT_CLINIC_OR_DEPARTMENT_OTHER): Payer: Self-pay | Admitting: Urology

## 2016-06-10 ENCOUNTER — Emergency Department (HOSPITAL_COMMUNITY): Payer: Commercial Managed Care - HMO

## 2016-06-10 ENCOUNTER — Encounter (HOSPITAL_COMMUNITY): Payer: Self-pay | Admitting: Emergency Medicine

## 2016-06-10 ENCOUNTER — Emergency Department (HOSPITAL_COMMUNITY)
Admission: EM | Admit: 2016-06-10 | Discharge: 2016-06-10 | Disposition: A | Discharge status: Home / Self Care | Payer: Commercial Managed Care - HMO | Attending: Emergency Medicine | Admitting: Emergency Medicine

## 2016-06-10 DIAGNOSIS — I10 Essential (primary) hypertension: Secondary | ICD-10-CM | POA: Diagnosis not present

## 2016-06-10 DIAGNOSIS — Y929 Unspecified place or not applicable: Secondary | ICD-10-CM | POA: Insufficient documentation

## 2016-06-10 DIAGNOSIS — R1033 Periumbilical pain: Secondary | ICD-10-CM | POA: Diagnosis present

## 2016-06-10 DIAGNOSIS — R1010 Upper abdominal pain, unspecified: Secondary | ICD-10-CM | POA: Insufficient documentation

## 2016-06-10 DIAGNOSIS — X500XXA Overexertion from strenuous movement or load, initial encounter: Secondary | ICD-10-CM | POA: Insufficient documentation

## 2016-06-10 DIAGNOSIS — Y93F2 Activity, caregiving, lifting: Secondary | ICD-10-CM | POA: Diagnosis not present

## 2016-06-10 DIAGNOSIS — R1031 Right lower quadrant pain: Secondary | ICD-10-CM | POA: Diagnosis not present

## 2016-06-10 DIAGNOSIS — Y999 Unspecified external cause status: Secondary | ICD-10-CM | POA: Insufficient documentation

## 2016-06-10 HISTORY — DX: Essential (primary) hypertension: I10

## 2016-06-10 LAB — COMPREHENSIVE METABOLIC PANEL
ALBUMIN: 4.1 g/dL (ref 3.5–5.0)
ALT: 39 U/L (ref 17–63)
AST: 27 U/L (ref 15–41)
Alkaline Phosphatase: 69 U/L (ref 38–126)
Anion gap: 7 (ref 5–15)
BUN: 18 mg/dL (ref 6–20)
CHLORIDE: 108 mmol/L (ref 101–111)
CO2: 25 mmol/L (ref 22–32)
CREATININE: 1.15 mg/dL (ref 0.61–1.24)
Calcium: 9 mg/dL (ref 8.9–10.3)
GFR calc non Af Amer: >60 mL/min (ref >60)
GLUCOSE: 106 mg/dL — AB (ref 65–99)
Potassium: 3.9 mmol/L (ref 3.5–5.1)
SODIUM: 140 mmol/L (ref 135–145)
Total Bilirubin: 0.8 mg/dL (ref 0.3–1.2)
Total Protein: 7.1 g/dL (ref 6.5–8.1)

## 2016-06-10 LAB — CBC WITH DIFFERENTIAL/PLATELET
BASOS ABS: 0.1 10*3/uL (ref 0.0–0.1)
BASOS PCT: 1 %
EOS ABS: 0.5 10*3/uL (ref 0.0–0.7)
EOS PCT: 5 %
HCT: 47.2 % (ref 39.0–52.0)
Hemoglobin: 16.1 g/dL (ref 13.0–17.0)
Lymphocytes Relative: 20 %
Lymphs Abs: 2 10*3/uL (ref 0.7–4.0)
MCH: 29.8 pg (ref 26.0–34.0)
MCHC: 34.1 g/dL (ref 30.0–36.0)
MCV: 87.4 fL (ref 78.0–100.0)
Monocytes Absolute: 0.6 10*3/uL (ref 0.1–1.0)
Monocytes Relative: 6 %
Neutro Abs: 7.3 10*3/uL (ref 1.7–7.7)
Neutrophils Relative %: 70 %
PLATELETS: 327 10*3/uL (ref 150–400)
RBC: 5.4 MIL/uL (ref 4.22–5.81)
RDW: 13 % (ref 11.5–15.5)
WBC: 10.4 10*3/uL (ref 4.0–10.5)

## 2016-06-10 LAB — URINALYSIS, ROUTINE W REFLEX MICROSCOPIC
Bilirubin Urine: NEGATIVE
Glucose, UA: NEGATIVE mg/dL
Ketones, ur: NEGATIVE mg/dL
LEUKOCYTES UA: NEGATIVE
NITRITE: NEGATIVE
PROTEIN: NEGATIVE mg/dL
pH: 6 (ref 5.0–8.0)

## 2016-06-10 LAB — URINE MICROSCOPIC-ADD ON
Squamous Epithelial / LPF: NONE SEEN
WBC UA: NONE SEEN WBC/hpf (ref 0–5)

## 2016-06-10 MED ORDER — IOPAMIDOL (ISOVUE-300) INJECTION 61%
100.0000 mL | Freq: Once | INTRAVENOUS | Status: AC | PRN
  Administered 2016-06-10: 100 mL via INTRAVENOUS

## 2016-06-10 MED ORDER — IOPAMIDOL (ISOVUE-300) INJECTION 61%: 15.0000 mL | Freq: Once | INTRAVENOUS | Status: DC | PRN

## 2016-06-10 MED ORDER — SODIUM CHLORIDE 0.9 % IV BOLUS (SEPSIS)
500.0000 mL | Freq: Once | INTRAVENOUS | Status: AC
  Administered 2016-06-10: 500 mL via INTRAVENOUS

## 2016-06-10 MED ORDER — ONDANSETRON HCL 4 MG/2ML IJ SOLN
4.0000 mg | Freq: Once | INTRAMUSCULAR | Status: AC
  Administered 2016-06-10: 4 mg via INTRAVENOUS
  Filled 2016-06-10: qty 2

## 2016-06-10 MED ORDER — HYDROCODONE-ACETAMINOPHEN 5-325 MG PO TABS: 1.0000 | ORAL_TABLET | Freq: Four times a day (QID) | ORAL | 0 refills | Status: DC | PRN

## 2016-06-10 MED ORDER — LISINOPRIL 20 MG PO TABS: 20.0000 mg | ORAL_TABLET | Freq: Every day | ORAL | 0 refills | Status: DC

## 2016-06-10 MED ORDER — HYDROMORPHONE HCL 1 MG/ML IJ SOLN
1.0000 mg | Freq: Once | INTRAMUSCULAR | Status: AC
  Administered 2016-06-10: 1 mg via INTRAVENOUS
  Filled 2016-06-10: qty 1

## 2016-06-10 NOTE — Discharge Instructions (Signed)
Follow-up with the family doctor for your blood pressure. Follow-up with Dr. Arnoldo Morale to check your abdominal problems. If the pain gets severe come back to the emergency department

## 2016-06-10 NOTE — ED Notes (Signed)
Pt reports moving a heavy dresser yesterday and when getting up from a lying position on the floor felt a pull and then began to have abd pain. States "I think it's a hernia", denies hx of hernia. Denies N/V/D.

## 2016-06-10 NOTE — ED Notes (Signed)
Pt reports he has not taken BP medication in 6 months because he doesn't think it works. Denies vision changes, HA, weakness, or slurred speech.

## 2016-06-10 NOTE — ED Triage Notes (Signed)
Pt c/o right side abd pain and "knot" to umbilicus. Pt reports lifting furniture yesterday.

## 2016-06-10 NOTE — ED Notes (Signed)
Soda given to pt family

## 2016-06-10 NOTE — ED Notes (Signed)
Pt not taking BP medication X 6 months. Given Rx at d/c

## 2016-06-10 NOTE — ED Provider Notes (Signed)
[place] DEPT Provider Note   CSN: [id number] Arrival date & time: 06/10/16  1255     History   Chief Complaint Chief Complaint  Patient presents with  . Abdominal Pain    HPI Anthony Frederick is a 60 y.o. male.  Patient states that yesterday he was doing some heavy lifting and then today he was pushing up on the floor and started having pain around his bellybutton. He also felt like there was a hard area there   The history is provided by the patient. No language interpreter was used.  Abdominal Pain   This is a new problem. The current episode started 6 to 12 hours ago. The problem occurs constantly. The problem has not changed since onset.Associated with: Movement. The pain is located in the periumbilical region. The quality of the pain is aching. The pain is at a severity of 5/10. Pertinent negatives include diarrhea, frequency, hematuria and headaches. Exacerbated by: movement. Nothing relieves the symptoms.    Past Medical History:  Diagnosis Date  . Hypertension   . Left ureteral calculus     There are no active problems to display for this patient.   Past Surgical History:  Procedure Laterality Date  . ANKLE FRACTURE SURGERY    . CYSTOSCOPY W/ RETROGRADES Left 04/19/2014   Procedure: CYSTOSCOPY WITH RETROGRADE PYELOGRAM/ INTERPRETATION;  Surgeon: Ailene Rud, MD;  Location[facility];  Service: Urology;  Laterality: Left;  . CYSTOSCOPY WITH STENT PLACEMENT Left 04/19/2014   Procedure: CYSTOSCOPY WITH STENT PLACEMENT;  Surgeon: Ailene Rud, MD;  Location: [facility];  Service: Urology;  Laterality: Left;  . CYSTOSCOPY WITH URETEROSCOPY Left 04/19/2014   Procedure: CYSTOSCOPY WITH URETEROSCOPY/ BASKET EXTRACTION;  Surgeon: Ailene Rud, MD;  Location: [facility];  Service: Urology;  Laterality: Left;       Home Medications    Prior to Admission medications   Not on File     Family History History reviewed. No pertinent family history.  Social History Social History  Substance Use Topics  . Smoking status: Never Smoker  . Smokeless tobacco: Never Used  . Alcohol use No     Allergies   Patient has no known allergies.   Review of Systems Review of Systems  Constitutional: Negative for appetite change and fatigue.  HENT: Negative for congestion, ear discharge and sinus pressure.   Eyes: Negative for discharge.  Respiratory: Negative for cough.   Cardiovascular: Negative for chest pain.  Gastrointestinal: Positive for abdominal pain. Negative for diarrhea.  Genitourinary: Negative for frequency and hematuria.  Musculoskeletal: Negative for back pain.  Skin: Negative for rash.  Neurological: Negative for seizures and headaches.  Psychiatric/Behavioral: Negative for hallucinations.     Physical Exam Updated Vital Signs BP (!) 169/103   Pulse 78   Temp 97.8 F (36.6 C) (Oral)   Resp 18   Ht 5\' 9"  (1.753 m)   Wt 265 lb (120.2 kg)   SpO2 93%   BMI 39.13 kg/m   Physical Exam  Constitutional: He is oriented to person, place, and time. He appears well-developed.  HENT:  Head: Normocephalic.  Eyes: Conjunctivae and EOM are normal. No scleral icterus.  Neck: Neck supple. No thyromegaly present.  Cardiovascular: Normal rate and regular rhythm.  Exam reveals no gallop and no friction rub.   No murmur heard. Pulmonary/Chest: No stridor. He has no wheezes. He has no rales. He exhibits no tenderness.  Abdominal: He exhibits no distension.  There is tenderness. There is no rebound.  Patient had tenderness to umbilicus area and a small mass to superior part of the umbilicus. Right lower quadrant tender  Musculoskeletal: Normal range of motion. He exhibits no edema.  Lymphadenopathy:    He has no cervical adenopathy.  Neurological: He is oriented to person, place, and time. He exhibits normal muscle tone. Coordination normal.  Skin: No rash  noted. No erythema.  Psychiatric: He has a normal mood and affect. His behavior is normal.     ED Treatments / Results  Labs (all labs ordered are listed, but only abnormal results are displayed) Labs Reviewed  URINALYSIS, ROUTINE W REFLEX MICROSCOPIC (NOT AT [facility]) - Abnormal; Notable for the following:       Result Value   Specific Gravity, Urine >1.030 (*)    Hgb urine dipstick TRACE (*)    All other components within normal limits  COMPREHENSIVE METABOLIC PANEL - Abnormal; Notable for the following:    Glucose, Bld 106 (*)    All other components within normal limits  URINE MICROSCOPIC-ADD ON - Abnormal; Notable for the following:    Bacteria, UA RARE (*)    All other components within normal limits  CBC WITH DIFFERENTIAL/PLATELET    EKG  EKG Interpretation None       Radiology No results found.  Procedures Procedures (including critical care time)  Medications Ordered in ED Medications  iopamidol (ISOVUE-300) 61 % injection 15 mL (not administered)  HYDROmorphone (DILAUDID) injection 1 mg (1 mg Intravenous Given 06/10/16 1351)  ondansetron (ZOFRAN) injection 4 mg (4 mg Intravenous Given 06/10/16 1350)  sodium chloride 0.9 % bolus 500 mL (0 mLs Intravenous Stopped 06/10/16 1528)  iopamidol (ISOVUE-300) 61 % injection 100 mL (100 mLs Intravenous Contrast Given 06/10/16 1531)     Initial Impression / Assessment and Plan / ED Course  I have reviewed the triage vital signs and the nursing notes.  Pertinent labs & imaging results that were available during my care of the patient were reviewed by me and considered in my medical decision making (see chart for details).  Clinical Course     Patient had CT scan of the abdomen which is pending. After the scan his abdomen was no longer tender was soft. Suspect umbilical hernia causing the problem. Labs unremarkable urine unremarkable patient will be referred to surgery for possible umbilical hernia he will also be put on  blood pressure medicine for his hypertension  Final Clinical Impressions(s) / ED Diagnoses   Final diagnoses:  None    New Prescriptions New Prescriptions   No medications on file     Milton Ferguson, MD 06/10/16 1555

## 2016-12-27 DIAGNOSIS — Z1211 Encounter for screening for malignant neoplasm of colon: Secondary | ICD-10-CM | POA: Diagnosis not present

## 2016-12-28 DIAGNOSIS — I10 Essential (primary) hypertension: Secondary | ICD-10-CM | POA: Diagnosis not present

## 2016-12-28 DIAGNOSIS — T464X5A Adverse effect of angiotensin-converting-enzyme inhibitors, initial encounter: Secondary | ICD-10-CM | POA: Diagnosis not present

## 2017-08-05 ENCOUNTER — Emergency Department (HOSPITAL_COMMUNITY): Payer: 59

## 2017-08-05 ENCOUNTER — Emergency Department (HOSPITAL_COMMUNITY)
Admission: EM | Admit: 2017-08-05 | Discharge: 2017-08-05 | Disposition: A | Discharge status: Home / Self Care | Payer: 59 | Attending: Emergency Medicine | Admitting: Emergency Medicine

## 2017-08-05 ENCOUNTER — Other Ambulatory Visit: Payer: Self-pay

## 2017-08-05 ENCOUNTER — Encounter (HOSPITAL_COMMUNITY): Payer: Self-pay | Admitting: Emergency Medicine

## 2017-08-05 DIAGNOSIS — I10 Essential (primary) hypertension: Secondary | ICD-10-CM | POA: Insufficient documentation

## 2017-08-05 DIAGNOSIS — R140 Abdominal distension (gaseous): Secondary | ICD-10-CM | POA: Diagnosis not present

## 2017-08-05 DIAGNOSIS — K2970 Gastritis, unspecified, without bleeding: Secondary | ICD-10-CM | POA: Diagnosis not present

## 2017-08-05 DIAGNOSIS — R1033 Periumbilical pain: Secondary | ICD-10-CM

## 2017-08-05 DIAGNOSIS — K760 Fatty (change of) liver, not elsewhere classified: Secondary | ICD-10-CM | POA: Diagnosis not present

## 2017-08-05 DIAGNOSIS — K429 Umbilical hernia without obstruction or gangrene: Secondary | ICD-10-CM | POA: Insufficient documentation

## 2017-08-05 DIAGNOSIS — R112 Nausea with vomiting, unspecified: Secondary | ICD-10-CM | POA: Insufficient documentation

## 2017-08-05 DIAGNOSIS — K469 Unspecified abdominal hernia without obstruction or gangrene: Secondary | ICD-10-CM | POA: Diagnosis not present

## 2017-08-05 HISTORY — DX: Ventral hernia without obstruction or gangrene: K43.9

## 2017-08-05 LAB — CBC WITH DIFFERENTIAL/PLATELET
Basophils Absolute: 0 10*3/uL (ref 0.0–0.1)
Basophils Relative: 0 %
Eosinophils Absolute: 0.2 10*3/uL (ref 0.0–0.7)
Eosinophils Relative: 2 %
HEMATOCRIT: 48.4 % (ref 39.0–52.0)
Hemoglobin: 15.9 g/dL (ref 13.0–17.0)
LYMPHS PCT: 39 %
Lymphs Abs: 2.5 10*3/uL (ref 0.7–4.0)
MCH: 28.9 pg (ref 26.0–34.0)
MCHC: 32.9 g/dL (ref 30.0–36.0)
MCV: 88 fL (ref 78.0–100.0)
MONO ABS: 0.5 10*3/uL (ref 0.1–1.0)
MONOS PCT: 7 %
Neutro Abs: 3.4 10*3/uL (ref 1.7–7.7)
Neutrophils Relative %: 52 %
Platelets: 272 10*3/uL (ref 150–400)
RBC: 5.5 MIL/uL (ref 4.22–5.81)
RDW: 12.9 % (ref 11.5–15.5)
WBC: 6.6 10*3/uL (ref 4.0–10.5)

## 2017-08-05 LAB — BASIC METABOLIC PANEL
Anion gap: 16 — ABNORMAL HIGH (ref 5–15)
BUN: 25 mg/dL — ABNORMAL HIGH (ref 6–20)
CO2: 23 mmol/L (ref 22–32)
Calcium: 9.7 mg/dL (ref 8.9–10.3)
Chloride: 101 mmol/L (ref 101–111)
Creatinine, Ser: 1.25 mg/dL — ABNORMAL HIGH (ref 0.61–1.24)
GFR calc Af Amer: >60 mL/min (ref >60)
GFR calc non Af Amer: >60 mL/min (ref >60)
GLUCOSE: 171 mg/dL — AB (ref 65–99)
Potassium: 3.7 mmol/L (ref 3.5–5.1)
Sodium: 140 mmol/L (ref 135–145)

## 2017-08-05 LAB — I-STAT CG4 LACTIC ACID, ED: LACTIC ACID, VENOUS: 2.72 mmol/L — AB (ref 0.5–1.9)

## 2017-08-05 MED ORDER — HYDROMORPHONE HCL 1 MG/ML IJ SOLN
1.0000 mg | Freq: Once | INTRAMUSCULAR | Status: DC
  Filled 2017-08-05: qty 1

## 2017-08-05 MED ORDER — HYDROMORPHONE HCL 1 MG/ML IJ SOLN
1.0000 mg | Freq: Once | INTRAMUSCULAR | Status: AC
  Administered 2017-08-05: 1 mg via INTRAVENOUS
  Filled 2017-08-05: qty 1

## 2017-08-05 MED ORDER — ONDANSETRON HCL 4 MG/2ML IJ SOLN
4.0000 mg | Freq: Once | INTRAMUSCULAR | Status: AC
  Administered 2017-08-05: 4 mg via INTRAVENOUS
  Filled 2017-08-05: qty 2

## 2017-08-05 MED ORDER — IOPAMIDOL (ISOVUE-300) INJECTION 61%
100.0000 mL | Freq: Once | INTRAVENOUS | Status: AC | PRN
  Administered 2017-08-05: 100 mL via INTRAVENOUS

## 2017-08-05 MED ORDER — ONDANSETRON 4 MG PO TBDP: 4.0000 mg | ORAL_TABLET | Freq: Three times a day (TID) | ORAL | 0 refills | Status: DC | PRN

## 2017-08-05 MED ORDER — SODIUM CHLORIDE 0.9 % IV BOLUS (SEPSIS)
500.0000 mL | Freq: Once | INTRAVENOUS | Status: AC
Start: 2017-08-05 — End: 2017-08-05
  Administered 2017-08-05: 500 mL via INTRAVENOUS

## 2017-08-05 MED ORDER — TRAMADOL HCL 50 MG PO TABS: 50.0000 mg | ORAL_TABLET | Freq: Four times a day (QID) | ORAL | 0 refills | Status: DC | PRN

## 2017-08-05 NOTE — ED Notes (Addendum)
Pt diaphoretic. Laying in bed now. edp aware. Dry heaving

## 2017-08-05 NOTE — Discharge Instructions (Signed)
You were seen in the ED today with abdominal pain from a hernia. The surgeon was able to reduce the hernia here in the ED. You should brace yourself if you need to cough, sneeze, or strain for a bowel movement. Follow up with the surgeon later this week. Return to the ED if the hernia comes back or suddenly becomes much more painful.

## 2017-08-05 NOTE — ED Notes (Signed)
Dr Constance Haw in and has already reduced hernia. Dilaudid not needed.

## 2017-08-05 NOTE — ED Triage Notes (Signed)
Pt c/o umbilical pain "I think I have a hernia". N/v and pain started early this am. N/v x 3 per pt. Protrusion noticed to umbilical area.

## 2017-08-05 NOTE — ED Provider Notes (Signed)
Emergency Department Provider Note   I have reviewed the triage vital signs and the nursing notes.   HISTORY  Chief Complaint Abdominal Pain and Emesis   HPI Anthony Frederick is a 62 y.o. male with PMH of HTN presents to the emergency department for evaluation of sudden periumbilical pain with worsening swelling, nausea, vomiting.  Patient has a known ventral hernia in this area but has not had operative repair.  Today he was moving a heavy object he noticed worsening protrusion and severe pain.  Patient states he tried to President himself but was unsuccessful.  He began having severe nausea and vomiting so presented to the emergency department. Denies any chest pain or difficulty breathing. No fever or chills.    Past Medical History:  Diagnosis Date  . Hernia of abdominal wall   . Hypertension   . Left ureteral calculus     Patient Active Problem List   Diagnosis Date Noted  . Umbilical hernia without obstruction and without gangrene     Past Surgical History:  Procedure Laterality Date  . ANKLE FRACTURE SURGERY    . CYSTOSCOPY W/ RETROGRADES Left 04/19/2014   Procedure: CYSTOSCOPY WITH RETROGRADE PYELOGRAM/ INTERPRETATION;  Surgeon: Ailene Rud, MD;  Location: [facility];  Service: Urology;  Laterality: Left;  . CYSTOSCOPY WITH STENT PLACEMENT Left 04/19/2014   Procedure: CYSTOSCOPY WITH STENT PLACEMENT;  Surgeon: Ailene Rud, MD;  Location: [facility];  Service: Urology;  Laterality: Left;  . CYSTOSCOPY WITH URETEROSCOPY Left 04/19/2014   Procedure: CYSTOSCOPY WITH URETEROSCOPY/ BASKET EXTRACTION;  Surgeon: Ailene Rud, MD;  Location: [facility];  Service: Urology;  Laterality: Left;    Current Outpatient Rx  . Order #: [id number] Class: Historical Med  . Order #: [id number] Class: Print  . Order #: [id number] Class: Print    Allergies Patient has no known allergies.  No family history on  file.  Social History Social History   Tobacco Use  . Smoking status: Never Smoker  . Smokeless tobacco: Never Used  Substance Use Topics  . Alcohol use: No  . Drug use: No    Review of Systems  Constitutional: No fever/chills Eyes: No visual changes. ENT: No sore throat. Cardiovascular: Denies chest pain. Respiratory: Denies shortness of breath. Gastrointestinal: Positive periumbilical abdominal pain. Positive nausea and vomiting.  No diarrhea.  No constipation. Genitourinary: Negative for dysuria. Musculoskeletal: Negative for back pain. Skin: Negative for rash. Neurological: Negative for headaches, focal weakness or numbness.  10-point ROS otherwise negative.  ____________________________________________   PHYSICAL EXAM:  VITAL SIGNS: ED Triage Vitals  Enc Vitals Group     BP 08/05/17 1038 (!) 146/80     Pulse Rate 08/05/17 1038 67     Resp 08/05/17 1038 20     Temp 08/05/17 1038 (!) 97.5 F (36.4 C)     Temp Source 08/05/17 1038 Oral     SpO2 08/05/17 1038 99 %     Pain Score 08/05/17 1036 10   Constitutional: Alert and oriented. Patient sitting on edge of bed, vomiting, appears uncomfortable. Slightly diaphoretic.  Eyes: Conjunctivae are normal.  Head: Atraumatic. Nose: No congestion/rhinnorhea. Mouth/Throat: Mucous membranes are moist.  Neck: No stridor.  Cardiovascular: Normal rate, regular rhythm. Good peripheral circulation. Grossly normal heart sounds.   Respiratory: Normal respiratory effort.  No retractions. Lungs CTAB. Gastrointestinal: Soft with firm periumbilical hernia that is tense. No overlying skin changes.  No distention.  Musculoskeletal: No lower extremity tenderness nor edema.  No gross deformities of extremities. Neurologic:  Normal speech and language. No gross focal neurologic deficits are appreciated.  Skin:  Skin is warm, dry and intact. No rash noted.  ____________________________________________   LABS (all labs ordered are  listed, but only abnormal results are displayed)  Labs Reviewed  BASIC METABOLIC PANEL - Abnormal; Notable for the following components:      Result Value   Glucose, Bld 171 (*)    BUN 25 (*)    Creatinine, Ser 1.25 (*)    Anion gap 16 (*)    All other components within normal limits  I-STAT CG4 LACTIC ACID, ED - Abnormal; Notable for the following components:   Lactic Acid, Venous 2.72 (*)    All other components within normal limits  CBC WITH DIFFERENTIAL/PLATELET  I-STAT CG4 LACTIC ACID, ED   ____________________________________________  RADIOLOGY  Ct Abdomen Pelvis W Contrast  Result Date: 08/05/2017 CLINICAL DATA:  Pt c/o umbilical pain "I think I have a hernia". N/v and pain started early this am. N/v x 3 per pt. Protrusion noticed to umbilical area. EXAM: CT ABDOMEN AND PELVIS WITH CONTRAST TECHNIQUE: Multidetector CT imaging of the abdomen and pelvis was performed using the standard protocol following bolus administration of intravenous contrast. CONTRAST:  173mL ISOVUE-300 IOPAMIDOL (ISOVUE-300) INJECTION 61% COMPARISON:  06/10/2016 FINDINGS: Lower chest: No acute abnormality. Hepatobiliary: Fatty liver without focal lesion or biliary ductal dilatation. Gallbladder physiologically distended. Pancreas: Unremarkable. No pancreatic ductal dilatation or surrounding inflammatory changes. Spleen: Normal in size without focal abnormality. Adrenals/Urinary Tract: Adrenal glands are unremarkable. Kidneys are normal, without renal calculi, focal lesion, or hydronephrosis. Bladder is unremarkable. Stomach/Bowel: Stomach is partially distended by fluid. Small bowel is nondilated. Normal appendix. The colon is decompressed with a few scattered distal descending diverticula. No adjacent inflammatory/edematous change. Vascular/Lymphatic: Minimal scattered aortoiliac arterial plaque. No aneurysm. No abdominal or pelvic adenopathy. Reproductive: Mild prostatic enlargement. Other: No ascites.  No free  air. Musculoskeletal: Interval enlargement of supraumbilical ventral hernia containing only mesenteric fat. There are mild inflammatory/edematous changes in the fat within and immediately deep to the hernia suggesting possibility of vascular compromise. No bowel involvement in the hernia. IMPRESSION: 1. Interval enlargement of ventral hernia containing mesenteric fat. New inflammatory/edematous change suggesting possibility of vascular compromise. 2. Fatty liver 3. Descending colonic diverticula. Electronically Signed   By: Lucrezia Europe M.D.   On: 08/05/2017 13:44    ____________________________________________   PROCEDURES  Procedure(s) performed:   Procedures  None ____________________________________________   INITIAL IMPRESSION / ASSESSMENT AND PLAN / ED COURSE  Pertinent labs & imaging results that were available during my care of the patient were reviewed by me and considered in my medical decision making (see chart for details).  Patient presents to the emergency department for evaluation of acute onset pain with nausea and vomiting.  Patient has a palpable periumbilical hernia that is firm.  I am unable to reduce this at bedside after pain medication.  No overlying skin changes.  Some concern for incarceration and possible small bowel obstruction given his nausea and vomiting.  Reviewed patient's imaging and labs results with Dr. Constance Haw who came to see the patient in the ED. She was able to reduce at bedside and patient is feeling much better. Will see him in the office later this week. Discharged home with instructions on bracing the abdomen with cough/sneezing.   At this time, I do not feel there is any life-threatening condition present. I have reviewed and discussed all results (EKG,  imaging, lab, urine as appropriate), exam findings with patient. I have reviewed nursing notes and appropriate previous records.  I feel the patient is safe to be discharged home without further  emergent workup. Discussed usual and customary return precautions. Patient and family (if present) verbalize understanding and are comfortable with this plan.  Patient will follow-up with their primary care provider. If they do not have a primary care provider, information for follow-up has been provided to them. All questions have been answered.  ____________________________________________  FINAL CLINICAL IMPRESSION(S) / ED DIAGNOSES  Final diagnoses:  Umbilical hernia without obstruction and without gangrene  Periumbilical abdominal pain  Non-intractable vomiting with nausea, unspecified vomiting type     MEDICATIONS GIVEN DURING THIS VISIT:  Medications  HYDROmorphone (DILAUDID) injection 1 mg (not administered)  sodium chloride 0.9 % bolus 500 mL (0 mLs Intravenous Stopped 08/05/17 1158)  ondansetron (ZOFRAN) injection 4 mg (4 mg Intravenous Given 08/05/17 1119)  HYDROmorphone (DILAUDID) injection 1 mg (1 mg Intravenous Given 08/05/17 1119)  HYDROmorphone (DILAUDID) injection 1 mg (1 mg Intravenous Given 08/05/17 1202)  iopamidol (ISOVUE-300) 61 % injection 100 mL (100 mLs Intravenous Contrast Given 08/05/17 1306)     NEW OUTPATIENT MEDICATIONS STARTED DURING THIS VISIT:  New Prescriptions   ONDANSETRON (ZOFRAN ODT) 4 MG DISINTEGRATING TABLET    Take 1 tablet (4 mg total) by mouth every 8 (eight) hours as needed for nausea or vomiting.   TRAMADOL (ULTRAM) 50 MG TABLET    Take 1 tablet (50 mg total) by mouth every 6 (six) hours as needed.    Note:  This document was prepared using Dragon voice recognition software and may include unintentional dictation errors.  Nanda Quinton, MD Emergency Medicine    Long, [facility], MD 08/05/17 [id number]

## 2017-08-05 NOTE — ED Notes (Addendum)
Pt palced on 2L [place] 02 due to oxygen down to 85%. Went to 91%. Pt now 96% on 3L [place]. Color slightly better

## 2017-08-05 NOTE — Consult Note (Signed)
[place]  Reason for Consult: Umbilical Hernia  Referring Physician: Dr. Laverta Baltimore   Chief Complaint    Abdominal Pain; Emesis      Anthony Frederick is a 62 y.o. male.  HPI: Anthony Frederick is a 62 yo with HTN who comes in with worsening umbilical pain and nausea/vomiting after lifting stuff off the floor. He reports that he has had the hernia from some time, but that it has been getting larger, and that it got stuck out and was causing him significant discomfort. He is usually able to push the contents back in. He reports coughing a lot last week, and feeling the hernia protrude.  He has seen Dr. Arnoldo Morale in the past, and had a CT 06/2016 that demonstrated a significantly smaller hernia at that time.  He has daily BMs. No prior abdominal surgeries.   Past Medical History:  Diagnosis Date  . Hernia of abdominal wall   . Hypertension   . Left ureteral calculus     Past Surgical History:  Procedure Laterality Date  . ANKLE FRACTURE SURGERY    . CYSTOSCOPY W/ RETROGRADES Left 04/19/2014   Procedure: CYSTOSCOPY WITH RETROGRADE PYELOGRAM/ INTERPRETATION;  Surgeon: Ailene Rud, MD;  Location: [facility];  Service: Urology;  Laterality: Left;  . CYSTOSCOPY WITH STENT PLACEMENT Left 04/19/2014   Procedure: CYSTOSCOPY WITH STENT PLACEMENT;  Surgeon: Ailene Rud, MD;  Location: [facility];  Service: Urology;  Laterality: Left;  . CYSTOSCOPY WITH URETEROSCOPY Left 04/19/2014   Procedure: CYSTOSCOPY WITH URETEROSCOPY/ BASKET EXTRACTION;  Surgeon: Ailene Rud, MD;  Location: [facility];  Service: Urology;  Laterality: Left;    No family history on file.  Social History   Tobacco Use  . Smoking status: Never Smoker  . Smokeless tobacco: Never Used  Substance Use Topics  . Alcohol use: No  . Drug use: No    Medications: I have reviewed the patient's current medications. Current  Facility-Administered Medications  Medication Dose Route Frequency Provider Last Rate Last Dose  . HYDROmorphone (DILAUDID) injection 1 mg  1 mg Intravenous Once Virl Cagey, MD       Current Outpatient Medications  Medication Sig Dispense Refill Last Dose  . losartan (COZAAR) 100 MG tablet Take one tablet daily.  1 08/05/2017 at Unknown time  . ondansetron (ZOFRAN ODT) 4 MG disintegrating tablet Take 1 tablet (4 mg total) by mouth every 8 (eight) hours as needed for nausea or vomiting. 20 tablet 0   . traMADol (ULTRAM) 50 MG tablet Take 1 tablet (50 mg total) by mouth every 6 (six) hours as needed. 15 tablet 0    Allergies: No Known Allergies  ROS:  A comprehensive review of systems was negative except for: Gastrointestinal: positive for abdominal pain, nausea and vomiting  Blood pressure (!) 139/97, pulse 89, temperature (!) 97.5 F (36.4 C), temperature source Oral, resp. rate 18, SpO2 99 %. Physical Exam  Constitutional: He is oriented to person, place, and time and well-developed, well-nourished, and in no distress.  HENT:  Head: Normocephalic.  Eyes: Pupils are equal, round, and reactive to light.  Neck: Normal range of motion.  Cardiovascular: Normal rate and regular rhythm.  Pulmonary/Chest: Effort normal.  Abdominal: Soft. He exhibits no distension.  No rebound or guarding, reduced hernia with minimal effort, minimally tender at the superior edge  Musculoskeletal: Normal range of motion.  Neurological: He is alert and oriented to person, place, and time.  Skin: Skin is warm and dry.  Psychiatric: Mood, memory, affect and judgment normal.  Vitals reviewed.   Results: Results for orders placed or performed during the hospital encounter of 08/05/17 (from the past 48 hour(s))  CBC with Differential     Status: None   Collection Time: 08/05/17 10:47 AM  Result Value Ref Range   WBC 6.6 4.0 - 10.5 K/uL   RBC 5.50 4.22 - 5.81 MIL/uL   Hemoglobin 15.9 13.0 - 17.0 g/dL    HCT 48.4 39.0 - 52.0 %   MCV 88.0 78.0 - 100.0 fL   MCH 28.9 26.0 - 34.0 pg   MCHC 32.9 30.0 - 36.0 g/dL   RDW 12.9 11.5 - 15.5 %   Platelets 272 150 - 400 K/uL   Neutrophils Relative % 52 %   Neutro Abs 3.4 1.7 - 7.7 K/uL   Lymphocytes Relative 39 %   Lymphs Abs 2.5 0.7 - 4.0 K/uL   Monocytes Relative 7 %   Monocytes Absolute 0.5 0.1 - 1.0 K/uL   Eosinophils Relative 2 %   Eosinophils Absolute 0.2 0.0 - 0.7 K/uL   Basophils Relative 0 %   Basophils Absolute 0.0 0.0 - 0.1 K/uL  Basic metabolic panel     Status: Abnormal   Collection Time: 08/05/17 10:47 AM  Result Value Ref Range   Sodium 140 135 - 145 mmol/L   Potassium 3.7 3.5 - 5.1 mmol/L   Chloride 101 101 - 111 mmol/L   CO2 23 22 - 32 mmol/L   Glucose, Bld 171 (H) 65 - 99 mg/dL   BUN 25 (H) 6 - 20 mg/dL   Creatinine, Ser 1.25 (H) 0.61 - 1.24 mg/dL   Calcium 9.7 8.9 - 10.3 mg/dL   GFR calc non Af Amer >60 >60 mL/min   GFR calc Af Amer >60 >60 mL/min    Comment: (NOTE) The eGFR has been calculated using the CKD EPI equation. This calculation has not been validated in all clinical situations. eGFR's persistently <60 mL/min signify possible Chronic Kidney Disease.    Anion gap 16 (H) 5 - 15  I-Stat CG4 Lactic Acid, ED     Status: Abnormal   Collection Time: 08/05/17 11:27 AM  Result Value Ref Range   Lactic Acid, Venous 2.72 (HH) 0.5 - 1.9 mmol/L   Personally reviewed CT and reviewed with Dr. Thornton Papas who spoke with Dr. Jarvis Newcomer (reading radiologist). Area of omentum likely protruding out versus some mesenteric fat given the edema around the mesentery in the bowel just adjacent to the hernia. No bowel in the hernia.   Ct Abdomen Pelvis W Contrast  Result Date: 08/05/2017 CLINICAL DATA:  Pt c/o umbilical pain "I think I have a hernia". N/v and pain started early this am. N/v x 3 per pt. Protrusion noticed to umbilical area. EXAM: CT ABDOMEN AND PELVIS WITH CONTRAST TECHNIQUE: Multidetector CT imaging of the abdomen and  pelvis was performed using the standard protocol following bolus administration of intravenous contrast. CONTRAST:  158m ISOVUE-300 IOPAMIDOL (ISOVUE-300) INJECTION 61% COMPARISON:  06/10/2016 FINDINGS: Lower chest: No acute abnormality. Hepatobiliary: Fatty liver without focal lesion or biliary ductal dilatation. Gallbladder physiologically distended. Pancreas: Unremarkable. No pancreatic ductal dilatation or surrounding inflammatory changes. Spleen: Normal in size without focal abnormality. Adrenals/Urinary Tract: Adrenal glands are unremarkable. Kidneys are normal, without renal calculi, focal lesion, or hydronephrosis. Bladder is unremarkable. Stomach/Bowel: Stomach is partially distended by fluid. Small bowel is nondilated. Normal appendix. The colon is decompressed with a few scattered  distal descending diverticula. No adjacent inflammatory/edematous change. Vascular/Lymphatic: Minimal scattered aortoiliac arterial plaque. No aneurysm. No abdominal or pelvic adenopathy. Reproductive: Mild prostatic enlargement. Other: No ascites.  No free air. Musculoskeletal: Interval enlargement of supraumbilical ventral hernia containing only mesenteric fat. There are mild inflammatory/edematous changes in the fat within and immediately deep to the hernia suggesting possibility of vascular compromise. No bowel involvement in the hernia. IMPRESSION: 1. Interval enlargement of ventral hernia containing mesenteric fat. New inflammatory/edematous change suggesting possibility of vascular compromise. 2. Fatty liver 3. Descending colonic diverticula. Electronically Signed   By: Lucrezia Europe M.D.   On: 08/05/2017 13:44     Assessment & Plan:  Anthony Frederick is a 62 y.o. male with a reducible umbilical hernia that contained fatty contents/ omentum versus some portion of mesentery. Luckily this was able to be reduced and the patient is feeling better. He thinks the majority of it reduced prior to my exam, and the RN agrees  saying that it was the size of a lemon prior to me coming and on my arrival this was not the case.   -Discussed what to watch out for as far as abdominal pain, nausea/vomiting, and told him to return to the hospital with any symptoms similar to this that did not get resolved with him reducing the hernia or if he is unable to reduce the hernia -Discussed holding his abdomen and pressing in with coughs, as he will have contents protrude with increased pressure -Discussed outpatient repair with mesh and urgency of repair if it gets stuck out and will not reduce due to the possibility of bowel being compromised   Future Appointments  Date Time Provider [place]  08/08/2017  1:00 PM Virl Cagey, MD [facility] None   All questions were answered to the satisfaction of the patient.  Virl Cagey 08/05/2017, 2:42 PM

## 2017-08-08 ENCOUNTER — Encounter: Payer: Self-pay | Admitting: General Surgery

## 2017-08-08 ENCOUNTER — Ambulatory Visit: Payer: 59 | Admitting: General Surgery

## 2017-08-08 VITALS — BP 183/101 | HR 70 | Temp 98.6°F | Resp 18 | Ht 70.0 in | Wt 268.0 lb

## 2017-08-08 DIAGNOSIS — K429 Umbilical hernia without obstruction or gangrene: Secondary | ICD-10-CM | POA: Diagnosis not present

## 2017-08-08 NOTE — H&P (Signed)
[facility] History and Physical  Reason for Referral: Umbilical hernia  Referring Physician:  Ed follow up   Chief Complaint    Umbilical Hernia      Anthony Frederick is a 62 y.o. male.  HPI: Anthony Frederick was seen in the Anthony Frederick for an umbilical hernia that contained omentum and concern for some fat stranding around his mesentery. I was able to reduce the hernia in the Anthony Frederick, and he went home with plans for follow up.  He reports having the hernia for years and it getting larger. It has also become larger with recent coughing. He has been pushing the hernia back into place, and is bracing himself if he coughs.  He has never had a colonoscopy but has fecal occult blood performed which was negative per his report. He has no history of any abdominal surgery.  He denies any obstructive type symptoms, and did have some nausea with the pain of the hernia being stuck out, but otherwise has been doing well.   His BP was high today but he has eaten sausage and other salty foods. He is taking his BP meds.   Past Medical History:  Diagnosis Date  . Hernia of abdominal wall   . Hypertension   . Left ureteral calculus     Past Surgical History:  Procedure Laterality Date  . ANKLE FRACTURE SURGERY    . CYSTOSCOPY W/ RETROGRADES Left 04/19/2014   Procedure: CYSTOSCOPY WITH RETROGRADE PYELOGRAM/ INTERPRETATION;  Surgeon: Ailene Rud, MD;  Location: [facility];  Service: Urology;  Laterality: Left;  . CYSTOSCOPY WITH STENT PLACEMENT Left 04/19/2014   Procedure: CYSTOSCOPY WITH STENT PLACEMENT;  Surgeon: Ailene Rud, MD;  Location[facility];  Service: Urology;  Laterality: Left;  . CYSTOSCOPY WITH URETEROSCOPY Left 04/19/2014   Procedure: CYSTOSCOPY WITH URETEROSCOPY/ BASKET EXTRACTION;  Surgeon: Ailene Rud, MD;  Location[facility];  Service: Urology;  Laterality: Left;    Family History  Problem Relation Age  of Onset  . Diabetes Mother   . Melanoma Father     Social History   Tobacco Use  . Smoking status: Never Smoker  . Smokeless tobacco: Never Used  Substance Use Topics  . Alcohol use: No  . Drug use: No    Medications: I have reviewed the patient's current medications. Allergies as of 08/08/2017   No Known Allergies     Medication List        Accurate as of 08/08/17  3:21 PM. Always use your most recent med list.          losartan 100 MG tablet Commonly known as:  COZAAR Take one tablet daily.   ondansetron 4 MG disintegrating tablet Commonly known as:  ZOFRAN ODT Take 1 tablet (4 mg total) by mouth every 8 (eight) hours as needed for nausea or vomiting.   traMADol 50 MG tablet Commonly known as:  ULTRAM Take 1 tablet (50 mg total) by mouth every 6 (six) hours as needed.        ROS:  A comprehensive review of systems was negative except for: Cardiovascular: positive for high blood pressure  Blood pressure (!) 183/101, pulse 70, temperature 98.6 F (37 C), resp. rate 18, height 5\' 10"  (1.778 m), weight 268 lb (121.6 kg). Physical Exam  Constitutional: He is oriented to person, place, and time and well-developed, well-nourished, and in no distress.  HENT:  Head: Normocephalic.  Eyes: Pupils are equal, round, and  reactive to light.  Neck: Normal range of motion.  Cardiovascular: Normal rate and regular rhythm.  Pulmonary/Chest: Effort normal. No respiratory distress.  Abdominal: Soft. He exhibits no distension. There is no tenderness. A hernia is present. Hernia confirmed positive in the umbilical area.  Reducible, defect about 2cm  Musculoskeletal: Normal range of motion.  Neurological: He is alert and oriented to person, place, and time.  Skin: Skin is warm and dry.  Psychiatric: Mood, memory, affect and judgment normal.  Vitals reviewed.   Results: CT a/p from Anthony Frederick visit, with omental fat in the hernia, some stranding around the mesentery  Assessment &  Plan:  Anthony Frederick is a 62 y.o. male with an umbilical hernia that required reduction in the Anthony Frederick with pain medication. He says it has been getting larger, and he wants it repaired. He knows that his weight will affect the risk of recurrence.  He also reports eating very salty food today, and this is why his BP is up.   -OR for umbilical hernia repair with mesh  -Take BP meds, stay away from salty foods    All questions were answered to the satisfaction of the patient and family.  The risk and benefits of umbilical hernia repair with mesh were discussed including but not limited to bleeding, infection, injury to bowel, risk of recurrence.  After careful consideration, Anthony Frederick has decided to proceed.    Virl Cagey 08/08/2017, 3:21 PM

## 2017-08-08 NOTE — Progress Notes (Signed)
[facility] History and Physical  Reason for Referral: Umbilical hernia  Referring Physician:  Ed follow up   Chief Complaint    Umbilical Hernia      Anthony Frederick is a 62 y.o. male.  HPI: Mr. Hashem was seen in the ED for an umbilical hernia that contained omentum and concern for some fat stranding around his mesentery. I was able to reduce the hernia in the ED, and he went home with plans for follow up.  He reports having the hernia for years and it getting larger. It has also become larger with recent coughing. He has been pushing the hernia back into place, and is bracing himself if he coughs.  He has never had a colonoscopy but has fecal occult blood performed which was negative per his report. He has no history of any abdominal surgery.  He denies any obstructive type symptoms, and did have some nausea with the pain of the hernia being stuck out, but otherwise has been doing well.   His BP was high today but he has eaten sausage and other salty foods. He is taking his BP meds.   Past Medical History:  Diagnosis Date  . Hernia of abdominal wall   . Hypertension   . Left ureteral calculus     Past Surgical History:  Procedure Laterality Date  . ANKLE FRACTURE SURGERY    . CYSTOSCOPY W/ RETROGRADES Left 04/19/2014   Procedure: CYSTOSCOPY WITH RETROGRADE PYELOGRAM/ INTERPRETATION;  Surgeon: Ailene Rud, MD;  Location: [facility];  Service: Urology;  Laterality: Left;  . CYSTOSCOPY WITH STENT PLACEMENT Left 04/19/2014   Procedure: CYSTOSCOPY WITH STENT PLACEMENT;  Surgeon: Ailene Rud, MD;  Location: [facility];  Service: Urology;  Laterality: Left;  . CYSTOSCOPY WITH URETEROSCOPY Left 04/19/2014   Procedure: CYSTOSCOPY WITH URETEROSCOPY/ BASKET EXTRACTION;  Surgeon: Ailene Rud, MD;  Location: [facility];  Service: Urology;  Laterality: Left;    Family History  Problem Relation Age  of Onset  . Diabetes Mother   . Melanoma Father     Social History   Tobacco Use  . Smoking status: Never Smoker  . Smokeless tobacco: Never Used  Substance Use Topics  . Alcohol use: No  . Drug use: No    Medications: I have reviewed the patient's current medications. Allergies as of 08/08/2017   No Known Allergies     Medication List        Accurate as of 08/08/17  3:21 PM. Always use your most recent med list.          losartan 100 MG tablet Commonly known as:  COZAAR Take one tablet daily.   ondansetron 4 MG disintegrating tablet Commonly known as:  ZOFRAN ODT Take 1 tablet (4 mg total) by mouth every 8 (eight) hours as needed for nausea or vomiting.   traMADol 50 MG tablet Commonly known as:  ULTRAM Take 1 tablet (50 mg total) by mouth every 6 (six) hours as needed.        ROS:  A comprehensive review of systems was negative except for: Cardiovascular: positive for high blood pressure  Blood pressure (!) 183/101, pulse 70, temperature 98.6 F (37 C), resp. rate 18, height 5\' 10"  (1.778 m), weight 268 lb (121.6 kg). Physical Exam  Constitutional: He is oriented to person, place, and time and well-developed, well-nourished, and in no distress.  HENT:  Head: Normocephalic.  Eyes: Pupils are equal, round, and  reactive to light.  Neck: Normal range of motion.  Cardiovascular: Normal rate and regular rhythm.  Pulmonary/Chest: Effort normal. No respiratory distress.  Abdominal: Soft. He exhibits no distension. There is no tenderness. A hernia is present. Hernia confirmed positive in the umbilical area.  Reducible, defect about 2cm  Musculoskeletal: Normal range of motion.  Neurological: He is alert and oriented to person, place, and time.  Skin: Skin is warm and dry.  Psychiatric: Mood, memory, affect and judgment normal.  Vitals reviewed.   Results: CT a/p from ED visit, with omental fat in the hernia, some stranding around the mesentery  Assessment &  Plan:  Anthony Frederick is a 62 y.o. male with an umbilical hernia that required reduction in the ED with pain medication. He says it has been getting larger, and he wants it repaired. He knows that his weight will affect the risk of recurrence.  He also reports eating very salty food today, and this is why his BP is up.   -OR for umbilical hernia repair with mesh  -Take BP meds, stay away from salty foods    All questions were answered to the satisfaction of the patient and family.  The risk and benefits of umbilical hernia repair with mesh were discussed including but not limited to bleeding, infection, injury to bowel, risk of recurrence.  After careful consideration, [name] has decided to proceed.    Virl Cagey 08/08/2017, 3:21 PM

## 2017-08-12 NOTE — Patient Instructions (Signed)
Anthony Frederick  08/12/2017     @PREFPERIOPPHARMACY @   Your procedure is scheduled on  08/19/2017   Report to [facility] at  1005   A.M.  Call this number if you have problems the morning of surgery:  [phone]   Remember:  Do not eat food or drink liquids after midnight.  Take these medicines the morning of surgery with A SIP OF WATER  Losartan, zofran, ultram.   Do not wear jewelry, make-up or nail polish.  Do not wear lotions, powders, or perfumes, or deodorant.  Do not shave 48 hours prior to surgery.  Men may shave face and neck.  Do not bring valuables to the hospital.  [facility] is not responsible for any belongings or valuables.  Contacts, dentures or bridgework may not be worn into surgery.  Leave your suitcase in the car.  After surgery it may be brought to your room.  For patients admitted to the hospital, discharge time will be determined by your treatment team.  Patients discharged the day of surgery will not be allowed to drive home.   Name and phone number of your driver:   family Special instructions:  None  Please read over the following fact sheets that you were given. Anesthesia Post-op Instructions and Care and Recovery After Surgery       Open Hernia Repair, Adult Open hernia repair is a surgical procedure to fix a hernia. A hernia occurs when an internal organ or tissue pushes out through a weak spot in the abdominal wall muscles. Hernias commonly occur in the groin and around the navel. Most hernias tend to get worse over time. Often, surgery is done to prevent the hernia from becoming bigger, uncomfortable, or an emergency. Emergency surgery may be needed if abdominal contents get stuck in the opening (incarcerated hernia) or the blood supply gets cut off (strangulated hernia). In an open repair, an incision is made in the abdomen to perform the surgery. Tell a health care provider about:  Any allergies you have.  All  medicines you are taking, including vitamins, herbs, eye drops, creams, and over-the-counter medicines.  Any problems you or family members have had with anesthetic medicines.  Any blood or bone disorders you have.  Any surgeries you have had.  Any medical conditions you have, including any recent cold or flu symptoms.  Whether you are pregnant or may be pregnant. What are the risks? Generally, this is a safe procedure. However, problems may occur, including:  Long-lasting (chronic) pain.  Bleeding.  Infection.  Damage to the testicle. This can cause shrinking or swelling.  Damage to the bladder, blood vessels, intestine, or nerves near the hernia.  Trouble passing urine.  Allergic reactions to medicines.  Return of the hernia.  What happens before the procedure? Staying hydrated Follow instructions from your health care provider about hydration, which may include:  Up to 2 hours before the procedure - you may continue to drink clear liquids, such as water, clear fruit juice, black coffee, and plain tea.  Eating and drinking restrictions Follow instructions from your health care provider about eating and drinking, which may include:  8 hours before the procedure - stop eating heavy meals or foods such as meat, fried foods, or fatty foods.  6 hours before the procedure - stop eating light meals or foods, such as toast or cereal.  6 hours before the procedure - stop drinking milk or  drinks that contain milk.  2 hours before the procedure - stop drinking clear liquids.  Medicines  Ask your health care provider about: ? Changing or stopping your regular medicines. This is especially important if you are taking diabetes medicines or blood thinners. ? Taking medicines such as aspirin and ibuprofen. These medicines can thin your blood. Do not take these medicines before your procedure if your health care provider instructs you not to.  You may be given antibiotic  medicine to help prevent infection. General instructions  You may have blood tests or imaging studies.  Ask your health care provider how your surgical site will be marked or identified.  If you smoke, do not smoke for at least 2 weeks before your procedure or for as long as told by your health care provider.  Let your health care provider know if you develop a cold or any infection before your surgery.  Plan to have someone take you home from the hospital or clinic.  If you will be going home right after the procedure, plan to have someone with you for 24 hours. What happens during the procedure?  To reduce your risk of infection: ? Your health care team will wash or sanitize their hands. ? Your skin will be washed with soap. ? Hair may be removed from the surgical area.  An IV tube will be inserted into one of your veins.  You will be given one or more of the following: ? A medicine to help you relax (sedative). ? A medicine to numb the area (local anesthetic). ? A medicine to make you fall asleep (general anesthetic).  Your surgeon will make an incision over the hernia.  The tissues of the hernia will be moved back into place.  The edges of the hernia may be stitched together.  The opening in the abdominal muscles will be closed with stitches (sutures). Or, your surgeon will place a mesh patch made of manmade (synthetic) material over the opening.  The incision will be closed.  A bandage (dressing) may be placed over the incision. The procedure may vary among health care providers and hospitals. What happens after the procedure?  Your blood pressure, heart rate, breathing rate, and blood oxygen level will be monitored until the medicines you were given have worn off.  You may be given medicine for pain.  Do not drive for 24 hours if you received a sedative. This information is not intended to replace advice given to you by your health care provider. Make sure you  discuss any questions you have with your health care provider. Document Released: 12/19/2000 Document Revised: 01/13/2016 Document Reviewed: 12/07/2015 Elsevier Interactive Patient Education  2018 [place], Adult, Care After These instructions give you information about caring for yourself after your procedure. Your doctor may also give you more specific instructions. If you have problems or questions, contact your doctor. Follow these instructions at home: Surgical cut (incision) care   Follow instructions from your doctor about how to take care of your surgical cut area. Make sure you: ? Wash your hands with soap and water before you change your bandage (dressing). If you cannot use soap and water, use hand sanitizer. ? Change your bandage as told by your doctor. ? Leave stitches (sutures), skin glue, or skin tape (adhesive) strips in place. They may need to stay in place for 2 weeks or longer. If tape strips get loose and curl up, you may trim the loose  edges. Do not remove tape strips completely unless your doctor says it is okay.  Check your surgical cut every day for signs of infection. Check for: ? More redness, swelling, or pain. ? More fluid or blood. ? Warmth. ? Pus or a bad smell. Activity  Do not drive or use heavy machinery while taking prescription pain medicine. Do not drive until your doctor says it is okay.  Until your doctor says it is okay: ? Do not lift anything that is heavier than 10 lb (4.5 kg). ? Do not play contact sports.  Return to your normal activities as told by your doctor. Ask your doctor what activities are safe. General instructions  To prevent or treat having a hard time pooping (constipation) while you are taking prescription pain medicine, your doctor may recommend that you: ? Drink enough fluid to keep your pee (urine) clear or pale yellow. ? Take over-the-counter or prescription medicines. ? Eat foods that are high in  fiber, such as fresh fruits and vegetables, whole grains, and beans. ? Limit foods that are high in fat and processed sugars, such as fried and sweet foods.  Take over-the-counter and prescription medicines only as told by your doctor.  Do not take baths, swim, or use a hot tub until your doctor says it is okay.  Keep all follow-up visits as told by your doctor. This is important. Contact a doctor if:  You develop a rash.  You have more redness, swelling, or pain around your surgical cut.  You have more fluid or blood coming from your surgical cut.  Your surgical cut feels warm to the touch.  You have pus or a bad smell coming from your surgical cut.  You have a fever or chills.  You have blood in your poop (stool).  You have not pooped in 2-3 days.  Medicine does not help your pain. Get help right away if:  You have chest pain or you are short of breath.  You feel light-headed.  You feel weak and dizzy (feel faint).  You have very bad pain.  You throw up (vomit) and your pain is worse. This information is not intended to replace advice given to you by your health care provider. Make sure you discuss any questions you have with your health care provider. Document Released: 07/16/2014 Document Revised: 01/13/2016 Document Reviewed: 12/07/2015 Elsevier Interactive Patient Education  2018 [place] Anesthesia, Adult General anesthesia is the use of medicines to make a person "go to sleep" (be unconscious) for a medical procedure. General anesthesia is often recommended when a procedure:  Is long.  Requires you to be still or in an unusual position.  Is major and can cause you to lose blood.  Is impossible to do without general anesthesia.  The medicines used for general anesthesia are called general anesthetics. In addition to making you sleep, the medicines:  Prevent pain.  Control your blood pressure.  Relax your muscles.  Tell a health care  provider about:  Any allergies you have.  All medicines you are taking, including vitamins, herbs, eye drops, creams, and over-the-counter medicines.  Any problems you or family members have had with anesthetic medicines.  Types of anesthetics you have had in the past.  Any bleeding disorders you have.  Any surgeries you have had.  Any medical conditions you have.  Any history of heart or lung conditions, such as heart failure, sleep apnea, or chronic obstructive pulmonary disease (COPD).  Whether you  are pregnant or may be pregnant.  Whether you use tobacco, alcohol, marijuana, or street drugs.  Any history of Armed forces logistics/support/administrative officer.  Any history of depression or anxiety. What are the risks? Generally, this is a safe procedure. However, problems may occur, including:  Allergic reaction to anesthetics.  Lung and heart problems.  Inhaling food or liquids from your stomach into your lungs (aspiration).  Injury to nerves.  Waking up during your procedure and being unable to move (rare).  Extreme agitation or a state of mental confusion (delirium) when you wake up from the anesthetic.  Air in the bloodstream, which can lead to stroke.  These problems are more likely to develop if you are having a major surgery or if you have an advanced medical condition. You can prevent some of these complications by answering all of your health care provider's questions thoroughly and by following all pre-procedure instructions. General anesthesia can cause side effects, including:  Nausea or vomiting  A sore throat from the breathing tube.  Feeling cold or shivery.  Feeling tired, washed out, or achy.  Sleepiness or drowsiness.  Confusion or agitation.  What happens before the procedure? Staying hydrated Follow instructions from your health care provider about hydration, which may include:  Up to 2 hours before the procedure - you may continue to drink clear liquids, such as  water, clear fruit juice, black coffee, and plain tea.  Eating and drinking restrictions Follow instructions from your health care provider about eating and drinking, which may include:  8 hours before the procedure - stop eating heavy meals or foods such as meat, fried foods, or fatty foods.  6 hours before the procedure - stop eating light meals or foods, such as toast or cereal.  6 hours before the procedure - stop drinking milk or drinks that contain milk.  2 hours before the procedure - stop drinking clear liquids.  Medicines  Ask your health care provider about: ? Changing or stopping your regular medicines. This is especially important if you are taking diabetes medicines or blood thinners. ? Taking medicines such as aspirin and ibuprofen. These medicines can thin your blood. Do not take these medicines before your procedure if your health care provider instructs you not to. ? Taking new dietary supplements or medicines. Do not take these during the week before your procedure unless your health care provider approves them.  If you are told to take a medicine or to continue taking a medicine on the day of the procedure, take the medicine with sips of water. General instructions   Ask if you will be going home the same day, the following day, or after a longer hospital stay. ? Plan to have someone take you home. ? Plan to have someone stay with you for the first 24 hours after you leave the hospital or clinic.  For 3-6 weeks before the procedure, try not to use any tobacco products, such as cigarettes, chewing tobacco, and e-cigarettes.  You may brush your teeth on the morning of the procedure, but make sure to spit out the toothpaste. What happens during the procedure?  You will be given anesthetics through a mask and through an IV tube in one of your veins.  You may receive medicine to help you relax (sedative).  As soon as you are asleep, a breathing tube may be used to  help you breathe.  An anesthesia specialist will stay with you throughout the procedure. He or she will help keep you  comfortable and safe by continuing to give you medicines and adjusting the amount of medicine that you get. He or she will also watch your blood pressure, pulse, and oxygen levels to make sure that the anesthetics do not cause any problems.  If a breathing tube was used to help you breathe, it will be removed before you wake up. The procedure may vary among health care providers and hospitals. What happens after the procedure?  You will wake up, often slowly, after the procedure is complete, usually in a recovery area.  Your blood pressure, heart rate, breathing rate, and blood oxygen level will be monitored until the medicines you were given have worn off.  You may be given medicine to help you calm down if you feel anxious or agitated.  If you will be going home the same day, your health care provider may check to make sure you can stand, drink, and urinate.  Your health care providers will treat your pain and side effects before you go home.  Do not drive for 24 hours if you received a sedative.  You may: ? Feel nauseous and vomit. ? Have a sore throat. ? Have mental slowness. ? Feel cold or shivery. ? Feel sleepy. ? Feel tired. ? Feel sore or achy, even in parts of your body where you did not have surgery. This information is not intended to replace advice given to you by your health care provider. Make sure you discuss any questions you have with your health care provider. Document Released: 10/02/2007 Document Revised: 12/06/2015 Document Reviewed: 06/09/2015 Elsevier Interactive Patient Education  2018 [place] Anesthesia, Adult, Care After These instructions provide you with information about caring for yourself after your procedure. Your health care provider may also give you more specific instructions. Your treatment has been planned according  to current medical practices, but problems sometimes occur. Call your health care provider if you have any problems or questions after your procedure. What can I expect after the procedure? After the procedure, it is common to have:  Vomiting.  A sore throat.  Mental slowness.  It is common to feel:  Nauseous.  Cold or shivery.  Sleepy.  Tired.  Sore or achy, even in parts of your body where you did not have surgery.  Follow these instructions at home: For at least 24 hours after the procedure:  Do not: ? Participate in activities where you could fall or become injured. ? Drive. ? Use heavy machinery. ? Drink alcohol. ? Take sleeping pills or medicines that cause drowsiness. ? Make important decisions or sign legal documents. ? Take care of children on your own.  Rest. Eating and drinking  If you vomit, drink water, juice, or soup when you can drink without vomiting.  Drink enough fluid to keep your urine clear or pale yellow.  Make sure you have little or no nausea before eating solid foods.  Follow the diet recommended by your health care provider. General instructions  Have a responsible adult stay with you until you are awake and alert.  Return to your normal activities as told by your health care provider. Ask your health care provider what activities are safe for you.  Take over-the-counter and prescription medicines only as told by your health care provider.  If you smoke, do not smoke without supervision.  Keep all follow-up visits as told by your health care provider. This is important. Contact a health care provider if:  You continue to have nausea  or vomiting at home, and medicines are not helpful.  You cannot drink fluids or start eating again.  You cannot urinate after 8-12 hours.  You develop a skin rash.  You have fever.  You have increasing redness at the site of your procedure. Get help right away if:  You have difficulty  breathing.  You have chest pain.  You have unexpected bleeding.  You feel that you are having a life-threatening or urgent problem. This information is not intended to replace advice given to you by your health care provider. Make sure you discuss any questions you have with your health care provider. Document Released: 10/01/2000 Document Revised: 11/28/2015 Document Reviewed: 06/09/2015 Elsevier Interactive Patient Education  [employer].

## 2017-08-14 ENCOUNTER — Other Ambulatory Visit: Payer: Self-pay

## 2017-08-14 ENCOUNTER — Encounter (HOSPITAL_COMMUNITY): Payer: Self-pay

## 2017-08-14 ENCOUNTER — Encounter (HOSPITAL_COMMUNITY)
Admission: RE | Admit: 2017-08-14 | Discharge: 2017-08-14 | Disposition: A | Discharge status: Home / Self Care | Payer: 59 | Source: Ambulatory Visit | Attending: General Surgery | Admitting: General Surgery

## 2017-08-14 DIAGNOSIS — Z01818 Encounter for other preprocedural examination: Secondary | ICD-10-CM | POA: Diagnosis not present

## 2017-08-14 DIAGNOSIS — R9431 Abnormal electrocardiogram [ECG] [EKG]: Secondary | ICD-10-CM | POA: Insufficient documentation

## 2017-08-14 HISTORY — DX: Personal history of urinary calculi: Z87.442

## 2017-08-14 NOTE — Progress Notes (Signed)
   08/14/17 1323  OBSTRUCTIVE SLEEP APNEA  Have you ever been diagnosed with sleep apnea through a sleep study? No  Do you snore loudly (loud enough to be heard through closed doors)?  1  Do you often feel tired, fatigued, or sleepy during the daytime (such as falling asleep during driving or talking to someone)? 0  Has anyone observed you stop breathing during your sleep? 0  Do you have, or are you being treated for high blood pressure? 1  BMI more than 35 kg/m2? 1  Age > 50 (1-yes) 1  Neck circumference greater than:Male 16 inches or larger, Male 17inches or larger? 1  Male Gender (Yes=1) 1  Obstructive Sleep Apnea Score 6  Score 5 or greater  Results sent to PCP

## 2017-08-19 ENCOUNTER — Ambulatory Visit (HOSPITAL_COMMUNITY): Payer: 59 | Admitting: Anesthesiology

## 2017-08-19 ENCOUNTER — Encounter (HOSPITAL_COMMUNITY)
Admission: RE | Disposition: A | Discharge status: Home / Self Care | Payer: Self-pay | Source: Ambulatory Visit | Attending: General Surgery

## 2017-08-19 ENCOUNTER — Other Ambulatory Visit: Payer: Self-pay

## 2017-08-19 ENCOUNTER — Ambulatory Visit (HOSPITAL_COMMUNITY)
Admission: RE | Admit: 2017-08-19 | Discharge: 2017-08-19 | Disposition: A | Discharge status: Home / Self Care | Payer: 59 | Source: Ambulatory Visit | Attending: General Surgery | Admitting: General Surgery

## 2017-08-19 ENCOUNTER — Encounter (HOSPITAL_COMMUNITY): Payer: Self-pay

## 2017-08-19 DIAGNOSIS — K429 Umbilical hernia without obstruction or gangrene: Secondary | ICD-10-CM | POA: Diagnosis not present

## 2017-08-19 DIAGNOSIS — I10 Essential (primary) hypertension: Secondary | ICD-10-CM | POA: Diagnosis not present

## 2017-08-19 DIAGNOSIS — R05 Cough: Secondary | ICD-10-CM | POA: Insufficient documentation

## 2017-08-19 DIAGNOSIS — Z79899 Other long term (current) drug therapy: Secondary | ICD-10-CM | POA: Insufficient documentation

## 2017-08-19 DIAGNOSIS — Z6838 Body mass index (BMI) 38.0-38.9, adult: Secondary | ICD-10-CM | POA: Insufficient documentation

## 2017-08-19 DIAGNOSIS — K436 Other and unspecified ventral hernia with obstruction, without gangrene: Secondary | ICD-10-CM

## 2017-08-19 HISTORY — PX: UMBILICAL HERNIA REPAIR: SHX196

## 2017-08-19 LAB — GLUCOSE, CAPILLARY: Glucose-Capillary: 101 mg/dL — ABNORMAL HIGH (ref 65–99)

## 2017-08-19 SURGERY — REPAIR, HERNIA, UMBILICAL, ADULT
Anesthesia: General

## 2017-08-19 MED ORDER — ONDANSETRON HCL 4 MG/2ML IJ SOLN
INTRAMUSCULAR | Status: AC
  Filled 2017-08-19: qty 2

## 2017-08-19 MED ORDER — SODIUM CHLORIDE 0.9 % IR SOLN
Status: DC | PRN
  Administered 2017-08-19: 1000 mL

## 2017-08-19 MED ORDER — BUPIVACAINE LIPOSOME 1.3 % IJ SUSP
INTRAMUSCULAR | Status: AC
  Filled 2017-08-19: qty 20

## 2017-08-19 MED ORDER — ROCURONIUM BROMIDE 100 MG/10ML IV SOLN
INTRAVENOUS | Status: DC | PRN
  Administered 2017-08-19: 40 mg via INTRAVENOUS
  Administered 2017-08-19: 10 mg via INTRAVENOUS

## 2017-08-19 MED ORDER — LIDOCAINE HCL (PF) 1 % IJ SOLN
INTRAMUSCULAR | Status: AC
  Filled 2017-08-19: qty 5

## 2017-08-19 MED ORDER — MIDAZOLAM HCL 2 MG/2ML IJ SOLN
1.0000 mg | INTRAMUSCULAR | Status: AC
  Administered 2017-08-19: 2 mg via INTRAVENOUS

## 2017-08-19 MED ORDER — MIDAZOLAM HCL 2 MG/2ML IJ SOLN
INTRAMUSCULAR | Status: AC
  Filled 2017-08-19: qty 2

## 2017-08-19 MED ORDER — LACTATED RINGERS IV SOLN
INTRAVENOUS | Status: DC
  Administered 2017-08-19 (×2): via INTRAVENOUS

## 2017-08-19 MED ORDER — ROCURONIUM BROMIDE 50 MG/5ML IV SOLN
INTRAVENOUS | Status: AC
Start: 2017-08-19 — End: ?
  Filled 2017-08-19: qty 2

## 2017-08-19 MED ORDER — ONDANSETRON HCL 4 MG/2ML IJ SOLN
4.0000 mg | Freq: Once | INTRAMUSCULAR | Status: AC
  Administered 2017-08-19: 4 mg via INTRAVENOUS

## 2017-08-19 MED ORDER — SUCCINYLCHOLINE CHLORIDE 20 MG/ML IJ SOLN
INTRAMUSCULAR | Status: DC | PRN
  Administered 2017-08-19: 200 mg via INTRAVENOUS

## 2017-08-19 MED ORDER — CHLORHEXIDINE GLUCONATE CLOTH 2 % EX PADS: 6.0000 | MEDICATED_PAD | Freq: Once | CUTANEOUS | Status: DC

## 2017-08-19 MED ORDER — DEXAMETHASONE SODIUM PHOSPHATE 4 MG/ML IJ SOLN
INTRAMUSCULAR | Status: AC
  Filled 2017-08-19: qty 1

## 2017-08-19 MED ORDER — FENTANYL CITRATE (PF) 250 MCG/5ML IJ SOLN
INTRAMUSCULAR | Status: AC
  Filled 2017-08-19: qty 5

## 2017-08-19 MED ORDER — PROPOFOL 10 MG/ML IV BOLUS
INTRAVENOUS | Status: DC | PRN
  Administered 2017-08-19: 200 mg via INTRAVENOUS

## 2017-08-19 MED ORDER — SUGAMMADEX SODIUM 200 MG/2ML IV SOLN
INTRAVENOUS | Status: AC
  Filled 2017-08-19: qty 2

## 2017-08-19 MED ORDER — LIDOCAINE HCL (CARDIAC) 20 MG/ML IV SOLN
INTRAVENOUS | Status: DC | PRN
  Administered 2017-08-19: 50 mg via INTRAVENOUS

## 2017-08-19 MED ORDER — FENTANYL CITRATE (PF) 100 MCG/2ML IJ SOLN: 25.0000 ug | INTRAMUSCULAR | Status: DC | PRN

## 2017-08-19 MED ORDER — PROPOFOL 10 MG/ML IV BOLUS
INTRAVENOUS | Status: AC
  Filled 2017-08-19: qty 20

## 2017-08-19 MED ORDER — FENTANYL CITRATE (PF) 100 MCG/2ML IJ SOLN
INTRAMUSCULAR | Status: DC | PRN
  Administered 2017-08-19 (×2): 50 ug via INTRAVENOUS
  Administered 2017-08-19: 100 ug via INTRAVENOUS
  Administered 2017-08-19: 50 ug via INTRAVENOUS

## 2017-08-19 MED ORDER — CEFAZOLIN SODIUM-DEXTROSE 2-4 GM/100ML-% IV SOLN
2.0000 g | INTRAVENOUS | Status: AC
  Administered 2017-08-19: 2 g via INTRAVENOUS
  Filled 2017-08-19: qty 100

## 2017-08-19 MED ORDER — KETOROLAC TROMETHAMINE 30 MG/ML IJ SOLN
30.0000 mg | Freq: Once | INTRAMUSCULAR | Status: AC
  Administered 2017-08-19: 30 mg via INTRAVENOUS
  Filled 2017-08-19: qty 1

## 2017-08-19 MED ORDER — SUGAMMADEX SODIUM 500 MG/5ML IV SOLN
INTRAVENOUS | Status: DC | PRN
  Administered 2017-08-19: 245 mg via INTRAVENOUS

## 2017-08-19 MED ORDER — SUGAMMADEX SODIUM 500 MG/5ML IV SOLN
INTRAVENOUS | Status: AC
  Filled 2017-08-19: qty 5

## 2017-08-19 MED ORDER — OXYCODONE HCL 5 MG PO TABS: 5.0000 mg | ORAL_TABLET | ORAL | 0 refills | Status: AC | PRN

## 2017-08-19 SURGICAL SUPPLY — 43 items
ADH SKN CLS APL DERMABOND .7 (GAUZE/BANDAGES/DRESSINGS) ×1
ADH SKN CLS LQ APL DERMABOND (GAUZE/BANDAGES/DRESSINGS) ×1
BAG HAMPER (MISCELLANEOUS) ×3 IMPLANT
BLADE SURG 15 STRL LF DISP TIS (BLADE) ×1 IMPLANT
BLADE SURG 15 STRL SS (BLADE) ×3
CHLORAPREP W/TINT 26ML (MISCELLANEOUS) ×3 IMPLANT
CLOTH BEACON ORANGE TIMEOUT ST (SAFETY) ×3 IMPLANT
COVER LIGHT HANDLE STERIS (MISCELLANEOUS) ×6 IMPLANT
DERMABOND ADHESIVE PROPEN (GAUZE/BANDAGES/DRESSINGS) ×2
DERMABOND ADVANCED (GAUZE/BANDAGES/DRESSINGS) ×2
DERMABOND ADVANCED .7 DNX12 (GAUZE/BANDAGES/DRESSINGS) ×1 IMPLANT
DERMABOND ADVANCED .7 DNX6 (GAUZE/BANDAGES/DRESSINGS) IMPLANT
ELECT REM PT RETURN 9FT ADLT (ELECTROSURGICAL) ×3
ELECTRODE REM PT RTRN 9FT ADLT (ELECTROSURGICAL) ×1 IMPLANT
GLOVE BIO SURGEON STRL SZ 6.5 (GLOVE) ×2 IMPLANT
GLOVE BIO SURGEONS STRL SZ 6.5 (GLOVE) ×1
GLOVE BIOGEL PI IND STRL 6.5 (GLOVE) ×1 IMPLANT
GLOVE BIOGEL PI IND STRL 7.0 (GLOVE) ×1 IMPLANT
GLOVE BIOGEL PI IND STRL 7.5 (GLOVE) IMPLANT
GLOVE BIOGEL PI INDICATOR 6.5 (GLOVE) ×4
GLOVE BIOGEL PI INDICATOR 7.0 (GLOVE) ×2
GLOVE BIOGEL PI INDICATOR 7.5 (GLOVE) ×2
GLOVE ECLIPSE 6.5 STRL STRAW (GLOVE) ×2 IMPLANT
GOWN STRL REUS W/ TWL LRG LVL3 (GOWN DISPOSABLE) ×1 IMPLANT
GOWN STRL REUS W/TWL LRG LVL3 (GOWN DISPOSABLE) ×6 IMPLANT
INST SET MINOR GENERAL (KITS) ×3 IMPLANT
KIT ROOM TURNOVER APOR (KITS) ×3 IMPLANT
MANIFOLD NEPTUNE II (INSTRUMENTS) ×3 IMPLANT
MESH VENTRALEX ST 8CM LRG (Mesh General) ×2 IMPLANT
NEEDLE HYPO 22GX1.5 SAFETY (NEEDLE) ×3 IMPLANT
NS IRRIG 1000ML POUR BTL (IV SOLUTION) ×3 IMPLANT
PACK MINOR (CUSTOM PROCEDURE TRAY) ×3 IMPLANT
PAD ARMBOARD 7.5X6 YLW CONV (MISCELLANEOUS) ×3 IMPLANT
SET BASIN LINEN APH (SET/KITS/TRAYS/PACK) ×3 IMPLANT
SPONGE GAUZE 2X2 8PLY STER LF (GAUZE/BANDAGES/DRESSINGS) ×1
SPONGE GAUZE 2X2 8PLY STRL LF (GAUZE/BANDAGES/DRESSINGS) ×1 IMPLANT
SUT ETHIBOND NAB MO 7 #0 18IN (SUTURE) ×5 IMPLANT
SUT MNCRL AB 4-0 PS2 18 (SUTURE) ×3 IMPLANT
SUT VIC AB 3-0 SH 27 (SUTURE) ×3
SUT VIC AB 3-0 SH 27XBRD (SUTURE) ×1 IMPLANT
SYR 20CC LL (SYRINGE) ×3 IMPLANT
TAPE HYPAFIX 6 X30' (GAUZE/BANDAGES/DRESSINGS) ×1
TAPE HYPAFIX 6X30 (GAUZE/BANDAGES/DRESSINGS) ×1 IMPLANT

## 2017-08-19 NOTE — Anesthesia Preprocedure Evaluation (Addendum)
Anesthesia Evaluation  Patient identified by MRN, date of birth, ID band Patient awake    Reviewed: Allergy & Precautions, H&P , NPO status , Patient's Chart, lab work & pertinent test results  Airway Mallampati: II  TM Distance: >3 FB Neck ROM: Full    Dental  (+) Teeth Intact   Pulmonary  Am cough   Pulmonary exam normal breath sounds clear to auscultation       Cardiovascular hypertension, Pt. on medications negative cardio ROS Normal cardiovascular exam Rhythm:Regular Rate:Normal     Neuro/Psych negative neurological ROS  negative psych ROS   GI/Hepatic negative GI ROS, Neg liver ROS,   Endo/Other  Morbid obesity  Renal/GU negative Renal ROS  negative genitourinary   Musculoskeletal negative musculoskeletal ROS (+)   Abdominal   Peds negative pediatric ROS (+)  Hematology negative hematology ROS (+)   Anesthesia Other Findings   Reproductive/Obstetrics negative OB ROS                            Anesthesia Physical Anesthesia Plan  ASA: II  Anesthesia Plan: General   Post-op Pain Management:    Induction: Intravenous  PONV Risk Score and Plan:   Airway Management Planned: Oral ETT  Additional Equipment:   Intra-op Plan:   Post-operative Plan: Extubation in OR  Informed Consent: I have reviewed the patients History and Physical, chart, labs and discussed the procedure including the risks, benefits and alternatives for the proposed anesthesia with the patient or authorized representative who has indicated his/her understanding and acceptance.     Plan Discussed with:   Anesthesia Plan Comments:        Anesthesia Quick Evaluation

## 2017-08-19 NOTE — Interval H&P Note (Signed)
History and Physical Interval Note:  08/19/2017 11:24 AM  Anthony Frederick  has presented today for surgery, with the diagnosis of umbilical hernia  The various methods of treatment have been discussed with the patient and family. After consideration of risks, benefits and other options for treatment, the patient has consented to  Procedure(s): HERNIA REPAIR UMBILICAL ADULT WITH MESH (N/A) as a surgical intervention .  The patient's history has been reviewed, patient examined, no change in status, stable for surgery.  I have reviewed the patient's chart and labs.  Questions were answered to the patient's satisfaction.    No questions.   Virl Cagey

## 2017-08-19 NOTE — Transfer of Care (Signed)
Immediate Anesthesia Transfer of Care Note  Patient: Anthony Frederick  Procedure(s) Performed: HERNIA REPAIR UMBILICAL ADULT WITH MESH (N/A )  Patient Location: PACU  Anesthesia Type:General  Level of Consciousness: drowsy  Airway & Oxygen Therapy: Patient Spontanous Breathing and Patient connected to face mask oxygen  Post-op Assessment: Report given to RN and Post -op Vital signs reviewed and stable  Post vital signs: Reviewed and stable  Last Vitals:  Vitals:   08/19/17 1115 08/19/17 1120  BP: (!) 170/99 (!) 152/100  Pulse:    Resp: 17 17  Temp:    SpO2: 94% 94%    Last Pain:  Vitals:   08/19/17 1047  TempSrc: Oral  PainSc: 0-No pain         Complications: No apparent anesthesia complications

## 2017-08-19 NOTE — Addendum Note (Signed)
Addendum  created 08/19/17 1336 by Lerry Liner, MD   Sign clinical note

## 2017-08-19 NOTE — Discharge Instructions (Signed)
Discharge Instructions: Shower per your regular routine. Take tylenol and ibuprofen as needed for pain control, alternating every 4-6 hours.  Take Roxicodone for breakthrough pain. Take colace for constipation related to narcotic pain medication. Do not pick at the dermabond glue on your incision sites.      Open Hernia Repair, Adult, Care After These instructions give you information about caring for yourself after your procedure. Your doctor may also give you more specific instructions. If you have problems or questions, contact your doctor. Follow these instructions at home: Surgical cut (incision) care   Follow instructions from your doctor about how to take care of your surgical cut area. Make sure you: ? Wash your hands with soap and water before you change your bandage (dressing). If you cannot use soap and water, use hand sanitizer. ? Change your bandage as told by your doctor. ? Leave stitches (sutures), skin glue, or skin tape (adhesive) strips in place. They may need to stay in place for 2 weeks or longer. If tape strips get loose and curl up, you may trim the loose edges. Do not remove tape strips completely unless your doctor says it is okay.  Check your surgical cut every day for signs of infection. Check for: ? More redness, swelling, or pain. ? More fluid or blood. ? Warmth. ? Pus or a bad smell. Activity  Do not drive or use heavy machinery while taking prescription pain medicine. Do not drive until your doctor says it is okay.  Until your doctor says it is okay: ? Do not lift anything that is heavier than 10 lb (4.5 kg). ? Do not play contact sports.  Return to your normal activities as told by your doctor. Ask your doctor what activities are safe. General instructions  To prevent or treat having a hard time pooping (constipation) while you are taking prescription pain medicine, your doctor may recommend that you: ? Drink enough fluid to keep your pee (urine)  clear or pale yellow. ? Take over-the-counter or prescription medicines. ? Eat foods that are high in fiber, such as fresh fruits and vegetables, whole grains, and beans. ? Limit foods that are high in fat and processed sugars, such as fried and sweet foods.  Take over-the-counter and prescription medicines only as told by your doctor.  Do not take baths, swim, or use a hot tub until your doctor says it is okay.  Keep all follow-up visits as told by your doctor. This is important. Contact a doctor if:  You develop a rash.  You have more redness, swelling, or pain around your surgical cut.  You have more fluid or blood coming from your surgical cut.  Your surgical cut feels warm to the touch.  You have pus or a bad smell coming from your surgical cut.  You have a fever or chills.  You have blood in your poop (stool).  You have not pooped in 2-3 days.  Medicine does not help your pain. Get help right away if:  You have chest pain or you are short of breath.  You feel light-headed.  You feel weak and dizzy (feel faint).  You have very bad pain.  You throw up (vomit) and your pain is worse. This information is not intended to replace advice given to you by your health care provider. Make sure you discuss any questions you have with your health care provider. Document Released: 07/16/2014 Document Revised: 01/13/2016 Document Reviewed: 12/07/2015 Elsevier Interactive Patient Education  2018 Else[employer]  [employer]    General Anesthesia, Adult, Care After These instructions provide you with information about caring for yourself after your procedure. Your health care provider may also give you more specific instructions. Your treatment has been planned according to current medical practices, but problems sometimes occur. Call your health care provider if you have any problems or questions after your procedure. What can I expect after the procedure? After the procedure, it is common  to have:  Vomiting.  A sore throat.  Mental slowness.  It is common to feel:  Nauseous.  Cold or shivery.  Sleepy.  Tired.  Sore or achy, even in parts of your body where you did not have surgery.  Follow these instructions at home: For at least 24 hours after the procedure:  Do not: ? Participate in activities where you could fall or become injured. ? Drive. ? Use heavy machinery. ? Drink alcohol. ? Take sleeping pills or medicines that cause drowsiness. ? Make important decisions or sign legal documents. ? Take care of children on your own.  Rest. Eating and drinking  If you vomit, drink water, juice, or soup when you can drink without vomiting.  Drink enough fluid to keep your urine clear or pale yellow.  Make sure you have little or no nausea before eating solid foods.  Follow the diet recommended by your health care provider. General instructions  Have a responsible adult stay with you until you are awake and alert.  Return to your normal activities as told by your health care provider. Ask your health care provider what activities are safe for you.  Take over-the-counter and prescription medicines only as told by your health care provider.  If you smoke, do not smoke without supervision.  Keep all follow-up visits as told by your health care provider. This is important. Contact a health care provider if:  You continue to have nausea or vomiting at home, and medicines are not helpful.  You cannot drink fluids or start eating again.  You cannot urinate after 8-12 hours.  You develop a skin rash.  You have fever.  You have increasing redness at the site of your procedure. Get help right away if:  You have difficulty breathing.  You have chest pain.  You have unexpected bleeding.  You feel that you are having a life-threatening or urgent problem. This information is not intended to replace advice given to you by your health care provider.  Make sure you discuss any questions you have with your health care provider. Document Released: 10/01/2000 Document Revised: 11/28/2015 Document Reviewed: 06/09/2015 Elsevier Interactive Patient Education  [employer].

## 2017-08-19 NOTE — Op Note (Signed)
[facility] Operative Note  08/19/17  Preoperative Diagnosis: Umbilical hernia    Postoperative Diagnosis: Epigastric and umbilical hernia    Procedure(s) Performed:  Epigastric and umbilical hernia repair with mesh (Ventralex Circular Patch 8cm)   Surgeon: Lanell Matar. Constance Haw, MD   Assistants: No qualified resident was available   Anesthesia: General endotracheal   Anesthesiologist: Lerry Liner, MD    Specimens: None   Estimated Blood Loss: Minimal   Blood Replacement: None    Complications: None   Wound Class: Clean    Operative Indications: Mr. Dirocco is a 62 yo who has recently been to the ED with an incarcerated hernia that was reduced by myself.  He had the hernia for sometime but the visit to the ED was the first encounter where it had been stuck out and caused him so much pain. After a discussion of the risk and benefits of repair with mesh, he opted to proceed.   Findings:Large epigastric hernia 2cm with incarcerated omentum, with small umbilical defect 3.6RW inferior    Procedure: The patient was taken to the operating room and placed supine. General endotracheal anesthesia was induced. Intravenous antibiotics were  administered per protocol.  An orogastric tube positioned to decompress the stomach. The abdomen was prepared and draped in the usual sterile fashion.   The hernia was partially reduced into the abdomen.  A supraumbilical incision was made and carried down through the subcutaneous tissue with cautery. The hernia sac was encountered, and it was large and chronic appearing.  The hernia sac was freed up from the edges of the fascia in the epigastric region, and then opened up, revealing chronically incarcerated omentum.  The omentum was reduced back into the abdomen and the hernia sac was excised to the level of the fascia. On feeling intraperitoneal a second umbilical defect was appreciated just inferior with a small < 48mm fascial bridge.  The umbilical defect was about 0.5cm.  Given the small bridge, the area was opened to make the defect one large defect about 3cm in size.   The mesh was placed into the abdominal cavity and secured laterally with 2-0 Novafil sutures. The tags were secured to the fascia with 2-0 Novafil sutures.  Given the tension in a horizontal closure of the defect, I decided to close it vertically along the 3cm defect.  Using interrupted 2-0 Novafil the entire defect was closed over the mesh.  The dead-space was closed with 3-0 Vicryl suture, and the skin was closed with a 4-0 Monocryl and dermabond.  The umbilical was intact and did not have to be re-approximated.    Final inspection revealed acceptable hemostasis. All counts were correct at the end of the case. The patient was awakened from anesthesia and extubate without complication.  The patient went to the PACU in stable condition.   Curlene Labrum, MD [facility] [address] [place], [place] [address] [phone] (office)

## 2017-08-19 NOTE — Anesthesia Postprocedure Evaluation (Signed)
Anesthesia Post Note  Patient: Anthony Frederick  Procedure(s) Performed: HERNIA REPAIR UMBILICAL ADULT WITH MESH (N/A )  Patient location during evaluation: PACU Anesthesia Type: General Level of consciousness: awake and alert, oriented and patient cooperative Pain management: pain level controlled Vital Signs Assessment: post-procedure vital signs reviewed and stable Respiratory status: spontaneous breathing Cardiovascular status: stable Postop Assessment: no apparent nausea or vomiting Anesthetic complications: no     Last Vitals:  Vitals:   08/19/17 1320 08/19/17 1330  BP: (!) 145/87 (!) 137/92  Pulse: 84 80  Resp: (!) 114 13  Temp: 36.8 C   SpO2: 99% 99%    Last Pain:  Vitals:   08/19/17 1330  TempSrc:   PainSc: 0-No pain                 ADAMS, AMY A

## 2017-08-19 NOTE — Anesthesia Procedure Notes (Signed)
Procedure Name: Intubation Date/Time: 08/19/2017 12:03 PM Performed by: Andree Elk, Zyniah Ferraiolo A, CRNA Pre-anesthesia Checklist: Patient identified, Patient being monitored, Timeout performed, Emergency Drugs available and Suction available Patient Re-evaluated:Patient Re-evaluated prior to induction Oxygen Delivery Method: Circle System Utilized and Circle system utilized Preoxygenation: Pre-oxygenation with 100% oxygen Induction Type: IV induction Laryngoscope Size: Miller and 3 Grade View: Grade II Tube type: Oral Tube size: 7.0 mm Number of attempts: 1 Airway Equipment and Method: Stylet Placement Confirmation: ETT inserted through vocal cords under direct vision,  positive ETCO2 and breath sounds checked- equal and bilateral Secured at: 21 cm Tube secured with: Tape Dental Injury: Teeth and Oropharynx as per pre-operative assessment

## 2017-08-20 ENCOUNTER — Telehealth: Payer: Self-pay | Admitting: General Surgery

## 2017-08-20 ENCOUNTER — Encounter (HOSPITAL_COMMUNITY): Payer: Self-pay | Admitting: General Surgery

## 2017-08-20 NOTE — Telephone Encounter (Signed)
Patient with some clear drainage. No redness, not unexpected due to size of cavity.  Monitor. Wear binder.  Curlene Labrum, MD [facility] [place] [address] [place], [place] [address] [phone] (office)

## 2017-09-03 ENCOUNTER — Ambulatory Visit (INDEPENDENT_AMBULATORY_CARE_PROVIDER_SITE_OTHER): Payer: Self-pay | Admitting: General Surgery

## 2017-09-03 ENCOUNTER — Encounter: Payer: Self-pay | Admitting: General Surgery

## 2017-09-03 VITALS — BP 179/106 | HR 93 | Temp 98.7°F | Resp 18 | Ht 70.0 in | Wt 272.0 lb

## 2017-09-03 DIAGNOSIS — K429 Umbilical hernia without obstruction or gangrene: Secondary | ICD-10-CM

## 2017-09-03 NOTE — Progress Notes (Signed)
[facility] Note   HPI:  62 y.o. Male presents to clinic for post-op follow-up evaluation after an umbilical hernia repair. He does report some bleeding from the incision but no pain or other issues. He is eating and drinking and having normal BMs and no nausea/ vomiting.   Review of Systems:  No pain No nausea/vomiting Some bleeding from incision  No fevers or chills  All other review of systems: otherwise negative   Vital Signs:  BP (!) 179/106   Pulse 93   Temp 98.7 F (37.1 C)   Resp 18   Ht 5\' 10"  (1.778 m)   Wt 272 lb (123.4 kg)   BMI 39.03 kg/m    Physical Exam:  Physical Exam  Constitutional: He is well-developed, well-nourished, and in no distress.  HENT:  Head: Normocephalic.  Eyes: Pupils are equal, round, and reactive to light.  Pulmonary/Chest: Effort normal.  Abdominal: Soft. He exhibits no distension. There is no tenderness.  Induration over the superior aspect of the umbilical incision, likely hematoma/ seroma, minimal bloody drainage from the left edge of the incision, incision clean and otherwise intact, no erythema, no signs of recurrence      Laboratory studies: None  Imaging:  None    Assessment:  62 y.o. yo Male s/p umbilical hernia repair. Having some drainage from likely the seroma/ hematoma formation.   Plan:  - Told him to watch out for signs of infection, redness of the skin, fevers, chills, tenderness   - Follow up in 6 weeks, the area may drain but keep dry gauze over it and it will slowly stop  - If all improved and induration gone and bleeding gone, can cancel the appt if wanted   All of the above recommendations were discussed with the patient and patient's family, and all of patient's and family's questions were answered to their expressed satisfaction.  Curlene Labrum, MD [facility] [address] [place], [place] [address] [phone] (office)

## 2017-10-15 ENCOUNTER — Ambulatory Visit: Payer: 59 | Admitting: General Surgery

## 2018-02-18 DIAGNOSIS — Z6838 Body mass index (BMI) 38.0-38.9, adult: Secondary | ICD-10-CM | POA: Diagnosis not present

## 2018-02-18 DIAGNOSIS — Z0001 Encounter for general adult medical examination with abnormal findings: Secondary | ICD-10-CM | POA: Diagnosis not present

## 2018-02-18 DIAGNOSIS — Z0000 Encounter for general adult medical examination without abnormal findings: Secondary | ICD-10-CM | POA: Diagnosis not present

## 2018-02-18 DIAGNOSIS — R7309 Other abnormal glucose: Secondary | ICD-10-CM | POA: Diagnosis not present

## 2018-03-18 DIAGNOSIS — Z6838 Body mass index (BMI) 38.0-38.9, adult: Secondary | ICD-10-CM | POA: Diagnosis not present

## 2018-03-18 DIAGNOSIS — I10 Essential (primary) hypertension: Secondary | ICD-10-CM | POA: Diagnosis not present

## 2018-07-14 DIAGNOSIS — Z6839 Body mass index (BMI) 39.0-39.9, adult: Secondary | ICD-10-CM | POA: Diagnosis not present

## 2018-07-14 DIAGNOSIS — M1711 Unilateral primary osteoarthritis, right knee: Secondary | ICD-10-CM | POA: Diagnosis not present

## 2018-07-14 DIAGNOSIS — J019 Acute sinusitis, unspecified: Secondary | ICD-10-CM | POA: Diagnosis not present

## 2019-10-20 ENCOUNTER — Encounter (HOSPITAL_COMMUNITY): Payer: Self-pay

## 2019-10-20 ENCOUNTER — Emergency Department (HOSPITAL_COMMUNITY): Payer: 59

## 2019-10-20 ENCOUNTER — Other Ambulatory Visit: Payer: Self-pay

## 2019-10-20 ENCOUNTER — Emergency Department (HOSPITAL_COMMUNITY)
Admission: EM | Admit: 2019-10-20 | Discharge: 2019-10-20 | Disposition: A | Discharge status: Home / Self Care | Payer: 59 | Attending: Emergency Medicine | Admitting: Emergency Medicine

## 2019-10-20 DIAGNOSIS — Y929 Unspecified place or not applicable: Secondary | ICD-10-CM | POA: Insufficient documentation

## 2019-10-20 DIAGNOSIS — S59902A Unspecified injury of left elbow, initial encounter: Secondary | ICD-10-CM | POA: Diagnosis present

## 2019-10-20 DIAGNOSIS — Z79899 Other long term (current) drug therapy: Secondary | ICD-10-CM | POA: Diagnosis not present

## 2019-10-20 DIAGNOSIS — S52182A Other fracture of upper end of left radius, initial encounter for closed fracture: Secondary | ICD-10-CM | POA: Diagnosis not present

## 2019-10-20 DIAGNOSIS — Y9389 Activity, other specified: Secondary | ICD-10-CM | POA: Insufficient documentation

## 2019-10-20 DIAGNOSIS — W19XXXA Unspecified fall, initial encounter: Secondary | ICD-10-CM | POA: Diagnosis not present

## 2019-10-20 DIAGNOSIS — I10 Essential (primary) hypertension: Secondary | ICD-10-CM | POA: Insufficient documentation

## 2019-10-20 DIAGNOSIS — Y998 Other external cause status: Secondary | ICD-10-CM | POA: Insufficient documentation

## 2019-10-20 MED ORDER — OXYCODONE-ACETAMINOPHEN 5-325 MG PO TABS
1.0000 | ORAL_TABLET | Freq: Once | ORAL | Status: AC
  Administered 2019-10-20: 1 via ORAL
  Filled 2019-10-20: qty 1

## 2019-10-20 MED ORDER — OXYCODONE-ACETAMINOPHEN 5-325 MG PO TABS: 1.0000 | ORAL_TABLET | Freq: Four times a day (QID) | ORAL | 0 refills | Status: DC | PRN

## 2019-10-20 NOTE — Discharge Instructions (Signed)
You have a radial head fracture.  Remain in splint until you are seen by orthopedics.  Use pain medication as prescribed, you can take ibuprofen in addition to this.  Ice and elevate the arm.  Call the orthopedist first thing tomorrow morning to schedule follow-up.

## 2019-10-20 NOTE — ED Provider Notes (Signed)
[facility] EMERGENCY DEPARTMENT Provider Note   CSN: [id number] Arrival date & time: 10/20/19  1702     History Chief Complaint  Patient presents with  . Fall    Anthony Frederick is a 64 y.o. male.  Anthony Frederick is a 64 y.o. male with a history of hypertension, and kidney stones, who presents to the emergency department for evaluation after a mechanical fall onto concrete.  Patient states that he landed on his left elbow and left side and also has pain in the right wrist.  He reports most severe pain is noted over the left elbow which he feels that he cannot completely straighten.  He has a small scrape to this area but no larger laceration.  He denies any numbness tingling or weakness in the left hand and denies pain at the left shoulder or wrist.  He reports some mild pain over the ulnar aspect of the right wrist where he thinks he caught himself as well.  No swelling he is able to move it with some discomfort.  He also has some pain over the left lower ribs but has not noticed any bruising, denies shortness of breath.  Did not hit his head during the fall, no loss of consciousness.  And denies any neck or back pain.  He has not taken anything for pain prior to arrival.        Past Medical History:  Diagnosis Date  . Hernia of abdominal wall   . History of kidney stones   . Hypertension   . Left ureteral calculus     Patient Active Problem List   Diagnosis Date Noted  . Incarcerated epigastric hernia   . Umbilical hernia without obstruction and without gangrene     Past Surgical History:  Procedure Laterality Date  . ANKLE FRACTURE SURGERY Left   . CYSTOSCOPY W/ RETROGRADES Left 04/19/2014   Procedure: CYSTOSCOPY WITH RETROGRADE PYELOGRAM/ INTERPRETATION;  Surgeon: Ailene Rud, MD;  Location: [facility];  Service: Urology;  Laterality: Left;  . CYSTOSCOPY WITH STENT PLACEMENT Left 04/19/2014   Procedure: CYSTOSCOPY WITH STENT PLACEMENT;   Surgeon: Ailene Rud, MD;  Location: [facility];  Service: Urology;  Laterality: Left;  . CYSTOSCOPY WITH URETEROSCOPY Left 04/19/2014   Procedure: CYSTOSCOPY WITH URETEROSCOPY/ BASKET EXTRACTION;  Surgeon: Ailene Rud, MD;  Location: [facility];  Service: Urology;  Laterality: Left;  . removal axillary cyst Left   . UMBILICAL HERNIA REPAIR N/A 08/19/2017   Procedure: HERNIA REPAIR UMBILICAL ADULT WITH MESH;  Surgeon: Virl Cagey, MD;  Location: [facility];  Service: General;  Laterality: N/A;       Family History  Problem Relation Age of Onset  . Diabetes Mother   . Melanoma Father     Social History   Tobacco Use  . Smoking status: Never Smoker  . Smokeless tobacco: Never Used  Substance Use Topics  . Alcohol use: No  . Drug use: No    Home Medications Prior to Admission medications   Medication Sig Start Date End Date Taking? Authorizing Provider  losartan (COZAAR) 100 MG tablet Take 100 mg by mouth daily.    [provider]  ondansetron (ZOFRAN ODT) 4 MG disintegrating tablet Take 1 tablet (4 mg total) by mouth every 8 (eight) hours as needed for nausea or vomiting. Patient not taking: Reported on 08/08/2017 08/05/17   Long, Wonda Olds, MD  oxyCODONE-acetaminophen (PERCOCET) 5-325 MG tablet Take 1  tablet by mouth every 6 (six) hours as needed. 10/20/19   Jacqlyn Larsen, PA-C  traMADol (ULTRAM) 50 MG tablet Take 1 tablet (50 mg total) by mouth every 6 (six) hours as needed. Patient not taking: Reported on 08/08/2017 08/05/17   Long, Wonda Olds, MD    Allergies    Patient has no known allergies.  Review of Systems   Review of Systems  Constitutional: Negative for chills and fever.  Cardiovascular: Positive for chest pain (Rib pain).  Musculoskeletal: Positive for arthralgias and joint swelling. Negative for back pain and neck pain.  Skin: Positive for wound (Abrasion). Negative for color change and rash.    Physical  Exam Updated Vital Signs BP (!) 150/86 (BP Location: Right Arm)   Pulse 72   Temp 98.1 F (36.7 C) (Oral)   Resp 18   Ht 5\' 11"  (1.803 m)   Wt 122.5 kg   SpO2 95%   BMI 37.66 kg/m   Physical Exam Vitals and nursing note reviewed.  Constitutional:      General: He is not in acute distress.    Appearance: Normal appearance. He is well-developed. He is not ill-appearing or diaphoretic.  HENT:     Head: Normocephalic and atraumatic.     Comments: No signs of head trauma. Eyes:     General:        Right eye: No discharge.        Left eye: No discharge.  Cardiovascular:     Rate and Rhythm: Normal rate and regular rhythm.     Heart sounds: Normal heart sounds. No murmur. No friction rub. No gallop.   Pulmonary:     Effort: Pulmonary effort is normal. No respiratory distress.     Breath sounds: Normal breath sounds.     Comments: There is some tenderness over the left lower ribs without palpable deformity, crepitus or overlying ecchymosis, breath sounds are clear with normal respiratory effort and good chest expansion bilaterally. Chest:     Chest wall: Tenderness present.  Abdominal:     General: Abdomen is flat. Bowel sounds are normal. There is no distension.     Palpations: Abdomen is soft. There is no mass.     Tenderness: There is no abdominal tenderness. There is no guarding.  Musculoskeletal:     Comments: Significant tenderness to palpation over the left elbow primarily over the lateral condyle with some swelling noted.  There is a superficial abrasion noted but no larger laceration or bleeding.  Unable to fully extend the elbow due to pain.  No pain at the wrist or shoulder.  2+ radial pulse and good cap refill, 5/5 grip strength and cardinal hand movements intact. Patient also has some tenderness over the ulnar aspect of his right wrist without palpable deformity or swelling.  Range of motion intact with some discomfort.  Normal strength and sensation, 2+ radial  pulse. All other joints supple and easily movable, all compartments soft.  Skin:    General: Skin is warm and dry.  Neurological:     Mental Status: He is alert and oriented to person, place, and time.     Coordination: Coordination normal.  Psychiatric:        Mood and Affect: Mood normal.        Behavior: Behavior normal.     ED Results / Procedures / Treatments   Labs (all labs ordered are listed, but only abnormal results are displayed) Labs Reviewed - No data to display  EKG None  Radiology DG Ribs Unilateral W/Chest Left  Result Date: 10/20/2019 CLINICAL DATA:  Recent fall with left rib pain, initial encounter EXAM: LEFT RIBS AND CHEST - 3+ VIEW COMPARISON:  None. FINDINGS: No fracture or other bone lesions are seen involving the ribs. There is no evidence of pneumothorax or pleural effusion. Both lungs are clear. Heart size and mediastinal contours are within normal limits. IMPRESSION: No acute rib fracture is noted.  No pneumothorax is seen. Electronically Signed   By: Inez Catalina M.D.   On: 10/20/2019 19:59   DG Elbow Complete Left  Result Date: 10/20/2019 CLINICAL DATA:  Recent fall with left elbow pain, initial encounter EXAM: LEFT ELBOW - COMPLETE 3+ VIEW COMPARISON:  None. FINDINGS: There is a comminuted fracture of the radial head with displacement of a bony fragment anteriorly into the joint space. The remainder of the lateral aspect of the radial head shows a depressed fracture. Joint effusion is seen. Proximal ulna and distal humerus appear within normal limits. IMPRESSION: Comminuted fracture of the radial head with both a depressed component laterally as well as a displaced fragment into the anterior joint space. Electronically Signed   By: Inez Catalina M.D.   On: 10/20/2019 19:56   DG Wrist Complete Right  Result Date: 10/20/2019 CLINICAL DATA:  Recent fall with right wrist pain, initial encounter EXAM: RIGHT WRIST - COMPLETE 3+ VIEW COMPARISON:  None. FINDINGS:  There are changes suggestive of prior healed fifth metacarpal fracture. Some degenerative changes in the carpal rows are noted. Mild soft tissue swelling is seen. No definitive fracture or dislocation is noted. IMPRESSION: Degenerative change in soft tissue swelling without acute bony abnormality. Electronically Signed   By: Inez Catalina M.D.   On: 10/20/2019 20:00   CT Elbow Left Wo Contrast  Result Date: 10/20/2019 CLINICAL DATA:  Left elbow fracture EXAM: CT OF THE UPPER LEFT EXTREMITY WITHOUT CONTRAST TECHNIQUE: Multidetector CT imaging of the upper left extremity was performed according to the standard protocol. COMPARISON:  Same-day radiograph FINDINGS: Bones/Joint/Cartilage There is a comminuted radial head fracture including a displaced fracture fragment which appears positioned anterior to the capitellum within the anterior joint capsule. Additional fracture extension into the proximal radioulnar joint and into the location of the annular ligament. Additional minimally displaced fractures versus spurring of both the coronoid process and posterior olecranon. Remaining osseous structures are intact and normally aligned. Moderate elbow joint effusion with lipohemarthrosis. Ligaments Suboptimally assessed by CT. Muscles and Tendons No intramuscular hemorrhage, musculotendinous injury or retraction is evident. Soft tissues Mild soft tissue swelling of the elbow is most pronounced posteriorly. No soft tissue gas or foreign body. IMPRESSION: 1. Comminuted radial head fracture. Fracture components include a displaced fragment anterior to the capitellum within the anterior joint capsule. Fracture lines extend into the proximal radioulnar joint and annular ligament. 2. Additional minimally displaced fractures versus spurring of both the coronoid process and summit of the posterior olecranon. 3. Moderate elbow joint effusion with lipohemarthrosis. Electronically Signed   By: Lovena Le M.D.   On: 10/20/2019  22:12    Procedures Procedures (including critical care time)  Medications Ordered in ED Medications  oxyCODONE-acetaminophen (PERCOCET/ROXICET) 5-325 MG per tablet 1 tablet (1 tablet Oral Given 10/20/19 2027)    ED Course  I have reviewed the triage vital signs and the nursing notes.  Pertinent labs & imaging results that were available during my care of the patient were reviewed by me and considered in my medical decision making (  see chart for details).    MDM Rules/Calculators/A&P                      64 year old male presents with pain after mechanical fall today.  Pain most severe over the left elbow but also complaining of pain in the right wrist and left lower ribs.  Swelling and tenderness over the left elbow, no other significant deformities noted, good chest expansion and no deformity over the left ribs, no concern for pneumothorax.  X-rays of the left elbow, right wrist and ribs obtained.  X-rays of the ribs and right wrist are unremarkable.  Right elbow films show a radial head fracture that is comminuted and displaced with fragment displaced into the anterior joint space.  Discussed x-ray results with on-call orthopedist Dr. Mardelle Matte who recommends CT of the left elbow and then posterior splinting and pain control.  He will see the patient in his office for follow-up.  CT scan again shows comminuted radial head fracture with intra-articular involvement as well as minimally displaced fractures versus spurring of both the coronoid process and the summit of the posterior olecranon with moderate joint effusion noted.  Patient placed in posterior splint.  Significant improvement in pain after Percocet here in the ED.  He will be discharged home with pain medication, educated on ice and elevation.  Close follow-up with orthopedics.  Return precautions discussed.  Patient expresses understanding and agreement with plan.  Final Clinical Impression(s) / ED Diagnoses Final diagnoses:   Fall, initial encounter  Closed comminuted fracture of proximal end of left radius, initial encounter    Rx / DC Orders ED Discharge Orders         Ordered    oxyCODONE-acetaminophen (PERCOCET) 5-325 MG tablet  Every 6 hours PRN     10/20/19 2316           Jacqlyn Larsen, PA-C 10/21/19 0135    Milton Ferguson, MD 10/21/19 1013

## 2019-10-20 NOTE — ED Triage Notes (Addendum)
Pt presents to ED after fall on concrete. Pt c/o left elbow pain, left side pain, right wrist pain. Pt denies LOC or hitting head.

## 2019-10-21 ENCOUNTER — Telehealth (HOSPITAL_COMMUNITY): Payer: Self-pay | Admitting: Emergency Medicine

## 2019-10-21 MED ORDER — OXYCODONE-ACETAMINOPHEN 5-325 MG PO TABS: 1.0000 | ORAL_TABLET | Freq: Four times a day (QID) | ORAL | 0 refills | Status: DC | PRN

## 2019-10-21 NOTE — Telephone Encounter (Cosign Needed)
Call from pt's pharmacy regarding prescription for oxycodone which was not received by them.  Review of chart - pt with significant fracture injury. Review of [place] database also reviewed.  New oxycodone prescription e scribed to pt's pharmacy.

## 2019-10-22 ENCOUNTER — Other Ambulatory Visit: Payer: Self-pay

## 2019-10-22 ENCOUNTER — Encounter (HOSPITAL_COMMUNITY)
Admission: RE | Admit: 2019-10-22 | Discharge: 2019-10-22 | Disposition: A | Discharge status: Home / Self Care | Payer: 59 | Source: Ambulatory Visit | Attending: Orthopedic Surgery | Admitting: Orthopedic Surgery

## 2019-10-22 ENCOUNTER — Encounter (HOSPITAL_COMMUNITY): Payer: Self-pay

## 2019-10-22 DIAGNOSIS — Z01818 Encounter for other preprocedural examination: Secondary | ICD-10-CM | POA: Diagnosis not present

## 2019-10-22 HISTORY — DX: Unspecified osteoarthritis, unspecified site: M19.90

## 2019-10-22 LAB — BASIC METABOLIC PANEL
Anion gap: 7 (ref 5–15)
BUN: 21 mg/dL (ref 8–23)
CO2: 25 mmol/L (ref 22–32)
Calcium: 8.9 mg/dL (ref 8.9–10.3)
Chloride: 107 mmol/L (ref 98–111)
Creatinine, Ser: 1.02 mg/dL (ref 0.61–1.24)
GFR calc Af Amer: >60 mL/min (ref >60)
GFR calc non Af Amer: >60 mL/min (ref >60)
Glucose, Bld: 123 mg/dL — ABNORMAL HIGH (ref 70–99)
Potassium: 4.5 mmol/L (ref 3.5–5.1)
Sodium: 139 mmol/L (ref 135–145)

## 2019-10-22 LAB — CBC
HCT: 45.8 % (ref 39.0–52.0)
Hemoglobin: 14.7 g/dL (ref 13.0–17.0)
MCH: 28.9 pg (ref 26.0–34.0)
MCHC: 32.1 g/dL (ref 30.0–36.0)
MCV: 90.2 fL (ref 80.0–100.0)
Platelets: 329 10*3/uL (ref 150–400)
RBC: 5.08 MIL/uL (ref 4.22–5.81)
RDW: 12.3 % (ref 11.5–15.5)
WBC: 8.8 10*3/uL (ref 4.0–10.5)
nRBC: 0 % (ref 0.0–0.2)

## 2019-10-22 NOTE — Progress Notes (Signed)
DUE TO COVID-19 ONLY ONE VISITOR IS ALLOWED TO COME WITH YOU AND STAY IN THE WAITING ROOM ONLY DURING PRE OP AND PROCEDURE DAY OF SURGERY. THE 1 VISITOR MAY VISIT WITH YOU AFTER SURGERY IN YOUR PRIVATE ROOM DURING VISITING HOURS ONLY!  YOU NEED TO HAVE A COVID 19 TEST ON___4/16/21 ____ @_______ , THIS TEST MUST BE DONE BEFORE SURGERY, COME  [place] [place], [place] , [id number].  ([place]) ONCE YOUR COVID TEST IS COMPLETED, PLEASE BEGIN THE QUARANTINE INSTRUCTIONS AS OUTLINED IN YOUR HANDOUT.                AJAX STASH  10/22/2019   Your procedure is scheduled on:  10/27/19   Report to [facility] Main  Entrance   Report to admitting at    1115 AM     Call this number if you have problems the morning of surgery [phone]    Remember: Do not eat food   :After Midnight. BRUSH YOUR TEETH MORNING OF SURGERY AND RINSE YOUR MOUTH OUT, NO CHEWING GUM CANDY OR MINTS.     Take these medicines the morning of surgery with A SIP OF WATER:  Diltiazem  DO NOT TAKE ANY DIABETIC MEDICATIONS DAY OF YOUR SURGERY                               You may not have any metal on your body including hair pins and              piercings  Do not wear jewelry, make-up, lotions, powders or perfumes, deodorant             Do not wear nail polish on your fingernails.  Do not shave  48 hours prior to surgery.              Men may shave face and neck.   Do not bring valuables to the hospital. [place].  Contacts, dentures or bridgework may not be worn into surgery.  Leave suitcase in the car. After surgery it may be brought to your room.     Patients discharged the day of surgery will not be allowed to drive home. IF YOU ARE HAVING SURGERY AND GOING HOME THE SAME DAY, YOU MUST HAVE AN ADULT TO DRIVE YOU HOME AND BE WITH YOU FOR 24 HOURS. YOU MAY GO HOME BY TAXI OR UBER OR ORTHERWISE, BUT AN ADULT MUST ACCOMPANY YOU HOME AND STAY  WITH YOU FOR 24 HOURS.  Name and phone number of your driver:  Special Instructions: N/A              Please read over the following fact sheets you were given: _____________________________________________________________________             NO SOLID FOOD AFTER MIDNIGHT THE NIGHT PRIOR TO SURGERY. NOTHING BY MOUTH EXCEPT CLEAR LIQUIDS UNTIL   1030am . PLEASE FINISH ENSURE DRINK PER SURGEON ORDER  WHICH NEEDS TO BE COMPLETED AT   1030am.   CLEAR LIQUID DIET   Foods Allowed  Foods Excluded  Coffee and tea, regular and decaf                             liquids that you cannot  Plain Jell-O any favor except red or purple                                           see through such as: Fruit ices (not with fruit pulp)                                     milk, soups, orange juice  Iced Popsicles                                    All solid food Carbonated beverages, regular and diet                                    Cranberry, grape and apple juices Sports drinks like Gatorade Lightly seasoned clear broth or consume(fat free) Sugar, honey syrup  Sample Menu Breakfast                                Lunch                                     Supper Cranberry juice                    Beef broth                            Chicken broth Jell-O                                     Grape juice                           Apple juice Coffee or tea                        Jell-O                                      Popsicle                                                Coffee or tea                        Coffee or tea  _____________________________________________________________________  [facility] - Preparing for Surgery Before surgery, you can play an important role.  Because skin is not sterile, your skin  needs to be as free of germs as possible.  You can reduce the number of germs on your skin by washing with CHG  (chlorahexidine gluconate) soap before surgery.  CHG is an antiseptic cleaner which kills germs and bonds with the skin to continue killing germs even after washing. Please DO NOT use if you have an allergy to CHG or antibacterial soaps.  If your skin becomes reddened/irritated stop using the CHG and inform your nurse when you arrive at Short Stay. Do not shave (including legs and underarms) for at least 48 hours prior to the first CHG shower.  You may shave your face/neck. Please follow these instructions carefully:  1.  Shower with CHG Soap the night before surgery and the  morning of Surgery.  2.  If you choose to wash your hair, wash your hair first as usual with your  normal  shampoo.  3.  After you shampoo, rinse your hair and body thoroughly to remove the  shampoo.                           4.  Use CHG as you would any other liquid soap.  You can apply chg directly  to the skin and wash                       Gently with a scrungie or clean washcloth.  5.  Apply the CHG Soap to your body ONLY FROM THE NECK DOWN.   Do not use on face/ open                           Wound or open sores. Avoid contact with eyes, ears mouth and genitals (private parts).                       Wash face,  Genitals (private parts) with your normal soap.             6.  Wash thoroughly, paying special attention to the area where your surgery  will be performed.  7.  Thoroughly rinse your body with warm water from the neck down.  8.  DO NOT shower/wash with your normal soap after using and rinsing off  the CHG Soap.                9.  Pat yourself dry with a clean towel.            10.  Wear clean pajamas.            11.  Place clean sheets on your bed the night of your first shower and do not  sleep with pets. Day of Surgery : Do not apply any lotions/deodorants the morning of surgery.  Please wear clean clothes to the hospital/surgery center.  FAILURE TO FOLLOW THESE INSTRUCTIONS MAY RESULT IN THE CANCELLATION OF  YOUR SURGERY PATIENT SIGNATURE_________________________________  NURSE SIGNATURE__________________________________  ________________________________________________________________________

## 2019-10-23 ENCOUNTER — Other Ambulatory Visit (HOSPITAL_COMMUNITY)
Admission: RE | Admit: 2019-10-23 | Discharge: 2019-10-23 | Disposition: A | Discharge status: Home / Self Care | Payer: 59 | Source: Ambulatory Visit | Attending: Orthopedic Surgery | Admitting: Orthopedic Surgery

## 2019-10-23 DIAGNOSIS — Z20822 Contact with and (suspected) exposure to covid-19: Secondary | ICD-10-CM | POA: Diagnosis not present

## 2019-10-23 DIAGNOSIS — Z01812 Encounter for preprocedural laboratory examination: Secondary | ICD-10-CM | POA: Diagnosis present

## 2019-10-24 LAB — SARS CORONAVIRUS 2 (TAT 6-24 HRS): SARS Coronavirus 2: NEGATIVE

## 2019-10-26 MED ORDER — DEXTROSE 5 % IV SOLN
3.0000 g | INTRAVENOUS | Status: AC
  Administered 2019-10-27: 3 g via INTRAVENOUS
  Filled 2019-10-26: qty 3

## 2019-10-27 ENCOUNTER — Ambulatory Visit (HOSPITAL_COMMUNITY): Payer: 59 | Admitting: Certified Registered Nurse Anesthetist

## 2019-10-27 ENCOUNTER — Encounter (HOSPITAL_COMMUNITY): Payer: Self-pay | Admitting: Orthopedic Surgery

## 2019-10-27 ENCOUNTER — Encounter (HOSPITAL_COMMUNITY)
Admission: RE | Disposition: A | Discharge status: Home / Self Care | Payer: Self-pay | Source: Other Acute Inpatient Hospital | Attending: Orthopedic Surgery

## 2019-10-27 ENCOUNTER — Ambulatory Visit (HOSPITAL_COMMUNITY)
Admission: RE | Admit: 2019-10-27 | Discharge: 2019-10-27 | Disposition: A | Discharge status: Home / Self Care | Payer: 59 | Source: Other Acute Inpatient Hospital | Attending: Orthopedic Surgery | Admitting: Orthopedic Surgery

## 2019-10-27 DIAGNOSIS — Z79899 Other long term (current) drug therapy: Secondary | ICD-10-CM | POA: Diagnosis not present

## 2019-10-27 DIAGNOSIS — I10 Essential (primary) hypertension: Secondary | ICD-10-CM | POA: Insufficient documentation

## 2019-10-27 DIAGNOSIS — S52122A Displaced fracture of head of left radius, initial encounter for closed fracture: Secondary | ICD-10-CM | POA: Diagnosis present

## 2019-10-27 DIAGNOSIS — W19XXXA Unspecified fall, initial encounter: Secondary | ICD-10-CM | POA: Insufficient documentation

## 2019-10-27 DIAGNOSIS — M1990 Unspecified osteoarthritis, unspecified site: Secondary | ICD-10-CM | POA: Insufficient documentation

## 2019-10-27 DIAGNOSIS — Z87891 Personal history of nicotine dependence: Secondary | ICD-10-CM | POA: Diagnosis not present

## 2019-10-27 DIAGNOSIS — Z881 Allergy status to other antibiotic agents status: Secondary | ICD-10-CM | POA: Insufficient documentation

## 2019-10-27 HISTORY — PX: RADIAL HEAD ARTHROPLASTY: SHX6044

## 2019-10-27 SURGERY — ARTHROPLASTY, RADIUS, HEAD
Anesthesia: General | Laterality: Left

## 2019-10-27 MED ORDER — FENTANYL CITRATE (PF) 100 MCG/2ML IJ SOLN
INTRAMUSCULAR | Status: AC
  Filled 2019-10-27: qty 2

## 2019-10-27 MED ORDER — LIDOCAINE HCL (CARDIAC) PF 100 MG/5ML IV SOSY
PREFILLED_SYRINGE | INTRAVENOUS | Status: DC | PRN
  Administered 2019-10-27: 20 mg via INTRAVENOUS

## 2019-10-27 MED ORDER — OXYCODONE HCL 5 MG PO TABS: 5.0000 mg | ORAL_TABLET | Freq: Once | ORAL | Status: DC | PRN

## 2019-10-27 MED ORDER — LACTATED RINGERS IV SOLN: INTRAVENOUS | Status: DC

## 2019-10-27 MED ORDER — ONDANSETRON HCL 4 MG/2ML IJ SOLN
INTRAMUSCULAR | Status: DC | PRN
  Administered 2019-10-27: 4 mg via INTRAVENOUS

## 2019-10-27 MED ORDER — FENTANYL CITRATE (PF) 100 MCG/2ML IJ SOLN
25.0000 ug | INTRAMUSCULAR | Status: DC | PRN
  Administered 2019-10-27: 50 ug via INTRAVENOUS

## 2019-10-27 MED ORDER — EPHEDRINE SULFATE-NACL 50-0.9 MG/10ML-% IV SOSY
PREFILLED_SYRINGE | INTRAVENOUS | Status: DC | PRN
  Administered 2019-10-27 (×3): 5 mg via INTRAVENOUS

## 2019-10-27 MED ORDER — BUPIVACAINE-EPINEPHRINE (PF) 0.5% -1:200000 IJ SOLN
INTRAMUSCULAR | Status: DC | PRN
  Administered 2019-10-27: 30 mL via PERINEURAL

## 2019-10-27 MED ORDER — PROPOFOL 10 MG/ML IV BOLUS
INTRAVENOUS | Status: DC | PRN
  Administered 2019-10-27: 200 mg via INTRAVENOUS

## 2019-10-27 MED ORDER — ACETAMINOPHEN 500 MG PO TABS: 500.0000 mg | ORAL_TABLET | Freq: Four times a day (QID) | ORAL | 0 refills | Status: DC | PRN

## 2019-10-27 MED ORDER — PROPOFOL 10 MG/ML IV BOLUS
INTRAVENOUS | Status: AC
  Filled 2019-10-27: qty 40

## 2019-10-27 MED ORDER — EPHEDRINE 5 MG/ML INJ
INTRAVENOUS | Status: AC
  Filled 2019-10-27: qty 10

## 2019-10-27 MED ORDER — LIDOCAINE 2% (20 MG/ML) 5 ML SYRINGE
INTRAMUSCULAR | Status: AC
  Filled 2019-10-27: qty 5

## 2019-10-27 MED ORDER — MIDAZOLAM HCL 2 MG/2ML IJ SOLN
1.0000 mg | Freq: Once | INTRAMUSCULAR | Status: AC
  Administered 2019-10-27: 1 mg via INTRAVENOUS
  Filled 2019-10-27: qty 2

## 2019-10-27 MED ORDER — ACETAMINOPHEN 500 MG PO TABS
1000.0000 mg | ORAL_TABLET | Freq: Once | ORAL | Status: AC
  Administered 2019-10-27: 12:00:00 1000 mg via ORAL
  Filled 2019-10-27: qty 2

## 2019-10-27 MED ORDER — FENTANYL CITRATE (PF) 100 MCG/2ML IJ SOLN
50.0000 ug | Freq: Once | INTRAMUSCULAR | Status: AC
  Administered 2019-10-27: 12:00:00 50 ug via INTRAVENOUS
  Filled 2019-10-27: qty 2

## 2019-10-27 MED ORDER — 0.9 % SODIUM CHLORIDE (POUR BTL) OPTIME
TOPICAL | Status: DC | PRN
  Administered 2019-10-27: 1000 mL

## 2019-10-27 MED ORDER — OXYCODONE HCL 5 MG/5ML PO SOLN: 5.0000 mg | Freq: Once | ORAL | Status: DC | PRN

## 2019-10-27 MED ORDER — FENTANYL CITRATE (PF) 100 MCG/2ML IJ SOLN
INTRAMUSCULAR | Status: DC | PRN
  Administered 2019-10-27 (×3): 50 ug via INTRAVENOUS

## 2019-10-27 MED ORDER — ONDANSETRON HCL 4 MG/2ML IJ SOLN: 4.0000 mg | Freq: Once | INTRAMUSCULAR | Status: DC | PRN

## 2019-10-27 MED ORDER — ONDANSETRON HCL 4 MG/2ML IJ SOLN
INTRAMUSCULAR | Status: AC
  Filled 2019-10-27: qty 2

## 2019-10-27 MED ORDER — DEXAMETHASONE SODIUM PHOSPHATE 10 MG/ML IJ SOLN
INTRAMUSCULAR | Status: DC | PRN
  Administered 2019-10-27: 4 mg via INTRAVENOUS

## 2019-10-27 SURGICAL SUPPLY — 50 items
BLADE CLIPPER SURG (BLADE) ×3 IMPLANT
BLADE OSCILLATING/SAGITTAL (BLADE) ×3
BLADE SW THK.38XMED LNG THN (BLADE) IMPLANT
BNDG CMPR 9X4 STRL LF SNTH (GAUZE/BANDAGES/DRESSINGS) ×1
BNDG ELASTIC 3X5.8 VLCR STR LF (GAUZE/BANDAGES/DRESSINGS) ×3 IMPLANT
BNDG ELASTIC 4X5.8 VLCR STR LF (GAUZE/BANDAGES/DRESSINGS) ×5 IMPLANT
BNDG ESMARK 4X9 LF (GAUZE/BANDAGES/DRESSINGS) ×3 IMPLANT
BNDG GAUZE ELAST 4 BULKY (GAUZE/BANDAGES/DRESSINGS) ×3 IMPLANT
COVER SURGICAL LIGHT HANDLE (MISCELLANEOUS) ×3 IMPLANT
COVER WAND RF STERILE (DRAPES) IMPLANT
CUFF TOURN SGL QUICK 18X4 (TOURNIQUET CUFF) IMPLANT
CUFF TOURN SGL QUICK 24 (TOURNIQUET CUFF)
CUFF TRNQT CYL 24X4X16.5-23 (TOURNIQUET CUFF) IMPLANT
DRAPE OEC MINIVIEW 54X84 (DRAPES) ×3 IMPLANT
DRAPE U-SHAPE 47X51 STRL (DRAPES) ×3 IMPLANT
ELECT REM PT RETURN 15FT ADLT (MISCELLANEOUS) ×3 IMPLANT
GAUZE SPONGE 4X4 12PLY STRL (GAUZE/BANDAGES/DRESSINGS) ×2 IMPLANT
GAUZE XEROFORM 5X9 LF (GAUZE/BANDAGES/DRESSINGS) ×3 IMPLANT
GLOVE BIO SURGEON STRL SZ7.5 (GLOVE) ×3 IMPLANT
GLOVE BIO SURGEON STRL SZ8 (GLOVE) ×3 IMPLANT
GLOVE BIOGEL PI IND STRL 8 (GLOVE) ×1 IMPLANT
GLOVE BIOGEL PI INDICATOR 8 (GLOVE) ×2
GLOVE ECLIPSE 7.5 STRL STRAW (GLOVE) ×3 IMPLANT
GOWN STRL REUS W/ TWL XL LVL3 (GOWN DISPOSABLE) ×1 IMPLANT
GOWN STRL REUS W/TWL LRG LVL3 (GOWN DISPOSABLE) ×3 IMPLANT
GOWN STRL REUS W/TWL XL LVL3 (GOWN DISPOSABLE) ×3
IMPL HEAD (Orthopedic Implant) IMPLANT
IMPL STEM WITH SCREW 8X29 (Stem) IMPLANT
IMPLANT HEAD (Orthopedic Implant) ×3 IMPLANT
IMPLANT STEM WITH SCREW 8X29 (Stem) ×3 IMPLANT
KIT BASIN OR (CUSTOM PROCEDURE TRAY) ×3 IMPLANT
KIT TURNOVER KIT A (KITS) IMPLANT
MANIFOLD NEPTUNE II (INSTRUMENTS) ×3 IMPLANT
NEEDLE HYPO 22GX1.5 SAFETY (NEEDLE) ×3 IMPLANT
PACK ORTHO EXTREMITY (CUSTOM PROCEDURE TRAY) ×3 IMPLANT
PAD CAST 4YDX4 CTTN HI CHSV (CAST SUPPLIES) ×1 IMPLANT
PADDING CAST ABS 4INX4YD NS (CAST SUPPLIES) ×2
PADDING CAST ABS COTTON 4X4 ST (CAST SUPPLIES) IMPLANT
PADDING CAST COTTON 4X4 STRL (CAST SUPPLIES) ×3
PENCIL SMOKE EVACUATOR (MISCELLANEOUS) IMPLANT
PROTECTOR NERVE ULNAR (MISCELLANEOUS) ×3 IMPLANT
SPLINT PLASTER CAST XFAST 5X30 (CAST SUPPLIES) IMPLANT
SPLINT PLASTER XFAST SET 5X30 (CAST SUPPLIES) ×2
SPONGE LAP 4X18 RFD (DISPOSABLE) ×3 IMPLANT
SUCTION FRAZIER HANDLE 10FR (MISCELLANEOUS) ×3
SUCTION TUBE FRAZIER 10FR DISP (MISCELLANEOUS) ×1 IMPLANT
SUT ETHILON 4 0 PS 2 18 (SUTURE) ×3 IMPLANT
SUT VIC AB 1-0 CT2 27 (SUTURE) ×2 IMPLANT
SUT VIC AB 3-0 SH 8-18 (SUTURE) ×2 IMPLANT
SYR CONTROL 10ML LL (SYRINGE) ×3 IMPLANT

## 2019-10-27 NOTE — H&P (Signed)
PREOPERATIVE H&P  Chief Complaint: Left elbow pain  HPI: CURT DUPLER is a 64 y.o. male who presents for preoperative history and physical with a diagnosis of comminuted displaced left radial head fracture. Symptoms are rated as moderate to severe, and have been worsening.  This is significantly impairing activities of daily living.  He has elected for surgical management.   Patient fell onto concrete on April 13, acute onset significant pain, difficulty using his arm.  X-rays demonstrated a comminuted displaced radial head fracture.  Past Medical History:  Diagnosis Date  . Arthritis   . Hernia of abdominal wall   . History of kidney stones   . Hypertension   . Left ureteral calculus    Past Surgical History:  Procedure Laterality Date  . ANKLE FRACTURE SURGERY Left   . CYSTOSCOPY W/ RETROGRADES Left 04/19/2014   Procedure: CYSTOSCOPY WITH RETROGRADE PYELOGRAM/ INTERPRETATION;  Surgeon: Ailene Rud, MD;  Location: [facility];  Service: Urology;  Laterality: Left;  . CYSTOSCOPY WITH STENT PLACEMENT Left 04/19/2014   Procedure: CYSTOSCOPY WITH STENT PLACEMENT;  Surgeon: Ailene Rud, MD;  Location: [facility];  Service: Urology;  Laterality: Left;  . CYSTOSCOPY WITH URETEROSCOPY Left 04/19/2014   Procedure: CYSTOSCOPY WITH URETEROSCOPY/ BASKET EXTRACTION;  Surgeon: Ailene Rud, MD;  Location: [facility];  Service: Urology;  Laterality: Left;  . removal axillary cyst Left   . UMBILICAL HERNIA REPAIR N/A 08/19/2017   Procedure: HERNIA REPAIR UMBILICAL ADULT WITH MESH;  Surgeon: Virl Cagey, MD;  Location: [facility];  Service: General;  Laterality: N/A;   Social History   Socioeconomic History  . Marital status: Married    Spouse name: Not on file  . Number of children: 1  . Years of education: 58  . Highest education level: Not on file  Occupational History  . Not on file  Tobacco Use  . Smoking  status: Former Smoker    Quit date: 07/09/1958    Years since quitting: 61.3  . Smokeless tobacco: Never Used  Substance and Sexual Activity  . Alcohol use: No  . Drug use: No  . Sexual activity: Yes    Birth control/protection: None  Other Topics Concern  . Not on file  Social History Narrative  . Not on file   Social Determinants of Health   Financial Resource Strain:   . Difficulty of Paying Living Expenses:   Food Insecurity:   . Worried About Charity fundraiser in the Last Year:   . Arboriculturist in the Last Year:   Transportation Needs:   . Film/video editor (Medical):   Marland Kitchen Lack of Transportation (Non-Medical):   Physical Activity:   . Days of Exercise per Week:   . Minutes of Exercise per Session:   Stress:   . Feeling of Stress :   Social Connections:   . Frequency of Communication with Friends and Family:   . Frequency of Social Gatherings with Friends and Family:   . Attends Religious Services:   . Active Member of Clubs or Organizations:   . Attends Archivist Meetings:   Marland Kitchen Marital Status:    Family History  Problem Relation Age of Onset  . Diabetes Mother   . Melanoma Father    Allergies  Allergen Reactions  . Levaquin [Levofloxacin] Rash   Prior to Admission medications   Medication Sig Start Date End Date Taking? Authorizing Provider  acetaminophen (TYLENOL) 500  MG tablet Take 1,000 mg by mouth every 6 (six) hours as needed for mild pain or headache.   Yes [provider]  diltiazem (TIAZAC) 300 MG 24 hr capsule Take 300 mg by mouth daily. 09/15/19  Yes [provider]  losartan (COZAAR) 50 MG tablet Take 50 mg by mouth daily.    Yes [provider]  oxyCODONE-acetaminophen (PERCOCET/ROXICET) 5-325 MG tablet Take 1 tablet by mouth every 6 (six) hours as needed. 10/21/19  Yes Idol, Almyra Free, PA-C     Positive ROS: All other systems have been reviewed and were otherwise negative with the exception of those  mentioned in the HPI and as above.  Physical Exam: General: Alert, no acute distress Cardiovascular: No pedal edema Respiratory: No cyanosis, no use of accessory musculature GI: No organomegaly, abdomen is soft and non-tender Skin: No lesions in the area of chief complaint Neurologic: Sensation intact distally Psychiatric: Patient is competent for consent with normal mood and affect Lymphatic: No axillary or cervical lymphadenopathy  MUSCULOSKELETAL: Left elbow has ecchymosis with pain to palpation and painful arc of motion.  All fingers flex extend and abduct.  Good capillary refill.  Assessment: Comminuted displaced left radial head fracture   Plan: Plan for Procedure(s): LEFT RADIAL HEAD ARTHROPLASTY  The risks benefits and alternatives were discussed with the patient including but not limited to the risks of nonoperative treatment, versus surgical intervention including infection, bleeding, nerve injury,  blood clots, cardiopulmonary complications, morbidity, mortality, among others, and they were willing to proceed.    Patient's anticipated LOS is less than 2 midnights, meeting these requirements: - Younger than 68 - Lives within 1 hour of care - Has a competent adult at home to recover with post-op recover - NO history of  - Chronic pain requiring opiods  - Diabetes  - Coronary Artery Disease  - Heart failure  - Heart attack  - Stroke  - DVT/VTE  - Cardiac arrhythmia  - Respiratory Failure/COPD  - Renal failure  - Anemia  - Advanced Liver disease        Johnny Bridge, MD Cell [phone]   10/27/2019 12:54 PM

## 2019-10-27 NOTE — Anesthesia Preprocedure Evaluation (Signed)
Anesthesia Evaluation  Patient identified by MRN, date of birth, ID band Patient awake    Reviewed: Allergy & Precautions, NPO status , Patient's Chart, lab work & pertinent test results  Airway Mallampati: II  TM Distance: >3 FB Neck ROM: Full    Dental no notable dental hx. (+) Teeth Intact, Caps   Pulmonary former smoker,    Pulmonary exam normal breath sounds clear to auscultation       Cardiovascular hypertension, Pt. on medications Normal cardiovascular exam Rhythm:Regular Rate:Normal     Neuro/Psych negative neurological ROS  negative psych ROS   GI/Hepatic negative GI ROS, Neg liver ROS,   Endo/Other  Obesity  Renal/GU Left renal calculus  negative genitourinary   Musculoskeletal  (+) Arthritis , Osteoarthritis,  Left Radial head Fx Ventral hernia   Abdominal (+) + obese,   Peds  Hematology negative hematology ROS (+)   Anesthesia Other Findings   Reproductive/Obstetrics                             Anesthesia Physical Anesthesia Plan  ASA: II  Anesthesia Plan: General   Post-op Pain Management:  Regional for Post-op pain   Induction: Intravenous  PONV Risk Score and Plan: 3 and Ondansetron, Dexamethasone, Treatment may vary due to age or medical condition and Midazolam  Airway Management Planned: LMA  Additional Equipment:   Intra-op Plan:   Post-operative Plan: Extubation in OR  Informed Consent: I have reviewed the patients History and Physical, chart, labs and discussed the procedure including the risks, benefits and alternatives for the proposed anesthesia with the patient or authorized representative who has indicated his/her understanding and acceptance.     Dental advisory given  Plan Discussed with: CRNA and Surgeon  Anesthesia Plan Comments:         Anesthesia Quick Evaluation

## 2019-10-27 NOTE — Anesthesia Procedure Notes (Signed)
Procedure Name: LMA Insertion Date/Time: 10/27/2019 2:05 PM Performed by: Raenette Rover, CRNA Pre-anesthesia Checklist: Patient identified, Emergency Drugs available, Suction available and Patient being monitored Patient Re-evaluated:Patient Re-evaluated prior to induction Oxygen Delivery Method: Circle system utilized Preoxygenation: Pre-oxygenation with 100% oxygen Induction Type: IV induction LMA: LMA inserted LMA Size: 5.0 Number of attempts: 1 Placement Confirmation: positive ETCO2 and breath sounds checked- equal and bilateral Tube secured with: Tape Dental Injury: Teeth and Oropharynx as per pre-operative assessment

## 2019-10-27 NOTE — Discharge Instructions (Signed)
Diet: As you were doing prior to hospitalization   Shower:  May shower but keep the wounds dry, use an occlusive plastic wrap, NO SOAKING IN TUB.  If the bandage gets wet, change with a clean dry gauze.  If you have a splint on, leave the splint in place and keep the splint dry with a plastic bag.  Dressing:  You may change your dressing 3-5 days after surgery, unless you have a splint.  If you have a splint, then just leave the splint in place and we will change your bandages during your first follow-up appointment.    If you had hand or foot surgery, we will plan to remove your stitches in about 2 weeks in the office.  For all other surgeries, there are sticky tapes (steri-strips) on your wounds and all the stitches are absorbable.  Leave the steri-strips in place when changing your dressings, they will peel off with time, usually 2-3 weeks.  Activity:  Increase activity slowly as tolerated, but follow the weight bearing instructions below.  The rules on driving is that you can not be taking narcotics while you drive, and you must feel in control of the vehicle.    Weight Bearing:   Non-weight bearing in left arm.    To prevent constipation: you may use a stool softener such as -  Colace (over the counter) 100 mg by mouth twice a day  Drink plenty of fluids (prune juice may be helpful) and high fiber foods Miralax (over the counter) for constipation as needed.    Itching:  If you experience itching with your medications, try taking only a single pain pill, or even half a pain pill at a time.  You may take up to 10 pain pills per day, and you can also use benadryl over the counter for itching or also to help with sleep.   Precautions:  If you experience chest pain or shortness of breath - call 911 immediately for transfer to the hospital emergency department!!  If you develop a fever greater that 101 F, purulent drainage from wound, increased redness or drainage from wound, or calf pain --  Call the office at [phone]                                                Follow- Up Appointment:  Please call for an appointment to be seen in 2 weeks [place] - [phone]

## 2019-10-27 NOTE — Transfer of Care (Signed)
Immediate Anesthesia Transfer of Care Note  Patient: Anthony Frederick  Procedure(s) Performed: LEFT RADIAL HEAD ARTHROPLASTY (Left )  Patient Location: PACU  Anesthesia Type:GA combined with regional for post-op pain  Level of Consciousness: drowsy and patient cooperative  Airway & Oxygen Therapy: Patient Spontanous Breathing and Patient connected to face mask oxygen  Post-op Assessment: Report given to RN and Post -op Vital signs reviewed and stable  Post vital signs: Reviewed and stable  Last Vitals:  Vitals Value Taken Time  BP 178/93 10/27/19 1558  Temp 36.5 C 10/27/19 1558  Pulse 72 10/27/19 1600  Resp 14 10/27/19 1600  SpO2 96 % 10/27/19 1600  Vitals shown include unvalidated device data.  Last Pain:  Vitals:   10/27/19 1225  TempSrc:   PainSc: 0-No pain      Patients Stated Pain Goal: 3 ([id number])  Complications: No apparent anesthesia complications

## 2019-10-27 NOTE — Anesthesia Postprocedure Evaluation (Signed)
Anesthesia Post Note  Patient: Anthony Frederick  Procedure(s) Performed: LEFT RADIAL HEAD ARTHROPLASTY (Left )     Patient location during evaluation: PACU Anesthesia Type: General Level of consciousness: awake and alert and oriented Pain management: pain level controlled Vital Signs Assessment: post-procedure vital signs reviewed and stable Respiratory status: spontaneous breathing, nonlabored ventilation and respiratory function stable Cardiovascular status: blood pressure returned to baseline and stable Postop Assessment: no apparent nausea or vomiting Anesthetic complications: no    Last Vitals:  Vitals:   10/27/19 1652 10/27/19 1707  BP: (!) 179/99 (!) 163/95  Pulse: 76   Resp: 14   Temp: 36.5 C   SpO2: 91%     Last Pain:  Vitals:   10/27/19 1652  TempSrc:   PainSc: 4                  [name]

## 2019-10-27 NOTE — Anesthesia Procedure Notes (Signed)
Anesthesia Regional Block: Supraclavicular block   Pre-Anesthetic Checklist: ,, timeout performed, Correct Patient, Correct Site, Correct Laterality, Correct Procedure, Correct Position, site marked, Risks and benefits discussed,  Surgical consent,  Pre-op evaluation,  At surgeon's request and post-op pain management  Laterality: Left  Prep: chloraprep       Needles:  Injection technique: Single-shot    Needle insertion depth: 7 cm   Additional Needles:   Procedures:,,,, ultrasound used (permanent image in chart),,,,  Narrative:  Start time: 10/27/2019 12:17 PM End time: 10/27/2019 12:22 PM  Performed by: Personally  Anesthesiologist: Josephine Igo, MD  Additional Notes: Timeout performed. Patient sedated. Relevant anatomy ID'd using [place]. Incremental 2-40ml injection of LA with frequent aspiration. Patient tolerated procedure well.        Left Supraclavicular Block

## 2019-10-27 NOTE — Op Note (Signed)
10/27/2019  3:38 PM  PATIENT:  Anthony Frederick    PRE-OPERATIVE DIAGNOSIS:  LEFT ELBOW RADIAL HEAD FRACTURE  POST-OPERATIVE DIAGNOSIS:  Same  PROCEDURE:  LEFT RADIAL HEAD ARTHROPLASTY 3 views of left elbow taken postoperatively demonstrate anatomic arthroplasty and a congruent joint.  SURGEON:  Johnny Bridge, MD  PHYSICIAN ASSISTANT: Merlene Pulling, PA-C, present and scrubbed throughout the case, critical for completion in a timely fashion, and for retraction, instrumentation, and closure.  ANESTHESIA:   General with regional block  PREOPERATIVE INDICATIONS:  Anthony Frederick is a  64 y.o. male with a diagnosis of LEFT ELBOW RADIAL HEAD FRACTURE who failed conservative measures and elected for surgical management.    The risks benefits and alternatives were discussed with the patient preoperatively including but not limited to the risks of infection, bleeding, nerve injury, cardiopulmonary complications, the need for revision surgery, among others, and the patient was willing to proceed.  ESTIMATED BLOOD LOSS: 50 mL  OPERATIVE IMPLANTS: Biomet radial head arthroplasty with a size 8 mm stem with a 10 mm modular radial head height by 22 mm diameter  OPERATIVE FINDINGS: Comminuted radial head fracture with over 50% of the radial head articular cartilage was damaged and displaced.  There was a large separate segment displaced into the anterior aspect of the ulnohumeral joint.  OPERATIVE PROCEDURE: The patient was brought to the operating room and placed in the supine position.  General anesthesia was administered.  The left upper extremity was prepped and draped in usual sterile fashion.  Timeout performed.  The arm was elevated and exsanguinated and tourniquet was inflated.  A lateral approach was carried out taking care to protect the lateral collateral ligament.  Dissection was carried down through the capsule, staying anterior to the radiocapitellar line in order to minimize ligament  damage.  Hemarthrosis was encountered.  I exposed the proximal radial neck, and then used a small oscillating saw to remove 14 mm of bone from the articular surface edge.  All the loose chondral debris was removed, including the large piece which took a little bit of effort to find that had basically traversed over to the other side of the joint.  Once I removed all the bony debris, I open the proximal canal, and then broached up to ultimately a size 8.  Benign would not go all the way down.  The 8 had pretty good feel, with interference fit, I trialed with the trial head which had excellent soft tissue tension and stability with a full range of motion.  I remove the trial instruments, irrigated the canal, placed the real implant, followed by the radial head, and then secured in place with the locking screw.  The entire construct moved as a single unit, and was stable to varus and valgus stress, and I examined the joint under live fluoroscopy and it had excellent reconstruction with appropriate restoration of the radial column.  I reassessed the stability to varus stress and felt stable so I did not feel I needed any further anchors.  I repaired the capsule with #1 Vicryl followed by 3-0 Vicryl for the muscular layer and then 3 oh for the subcutaneous tissue with Steri-Strips and sterile gauze followed by posterior splint.  He was awakened and returned the PACU in stable and satisfactory condition.  There were no complications and he tolerated the procedure well.

## 2019-10-28 ENCOUNTER — Encounter: Payer: Self-pay | Admitting: *Deleted

## 2020-06-20 ENCOUNTER — Other Ambulatory Visit (HOSPITAL_COMMUNITY): Payer: Self-pay | Admitting: Orthopedic Surgery

## 2020-06-20 ENCOUNTER — Other Ambulatory Visit: Payer: Self-pay

## 2020-06-20 ENCOUNTER — Ambulatory Visit (HOSPITAL_COMMUNITY)
Admission: RE | Admit: 2020-06-20 | Discharge: 2020-06-20 | Disposition: A | Discharge status: Home / Self Care | Payer: 59 | Source: Ambulatory Visit | Attending: Orthopedic Surgery | Admitting: Orthopedic Surgery

## 2020-06-20 DIAGNOSIS — M7989 Other specified soft tissue disorders: Secondary | ICD-10-CM

## 2020-06-20 DIAGNOSIS — M79604 Pain in right leg: Secondary | ICD-10-CM | POA: Insufficient documentation

## 2020-12-29 IMAGING — DX DG RIBS W/ CHEST 3+V*L*
6 series · 6 of 6 positions shown · non-contrast
Comparison: None.

CLINICAL DATA: Recent fall with left rib pain, initial encounter

EXAM:
LEFT RIBS AND CHEST - 3+ VIEW

[chest pa]
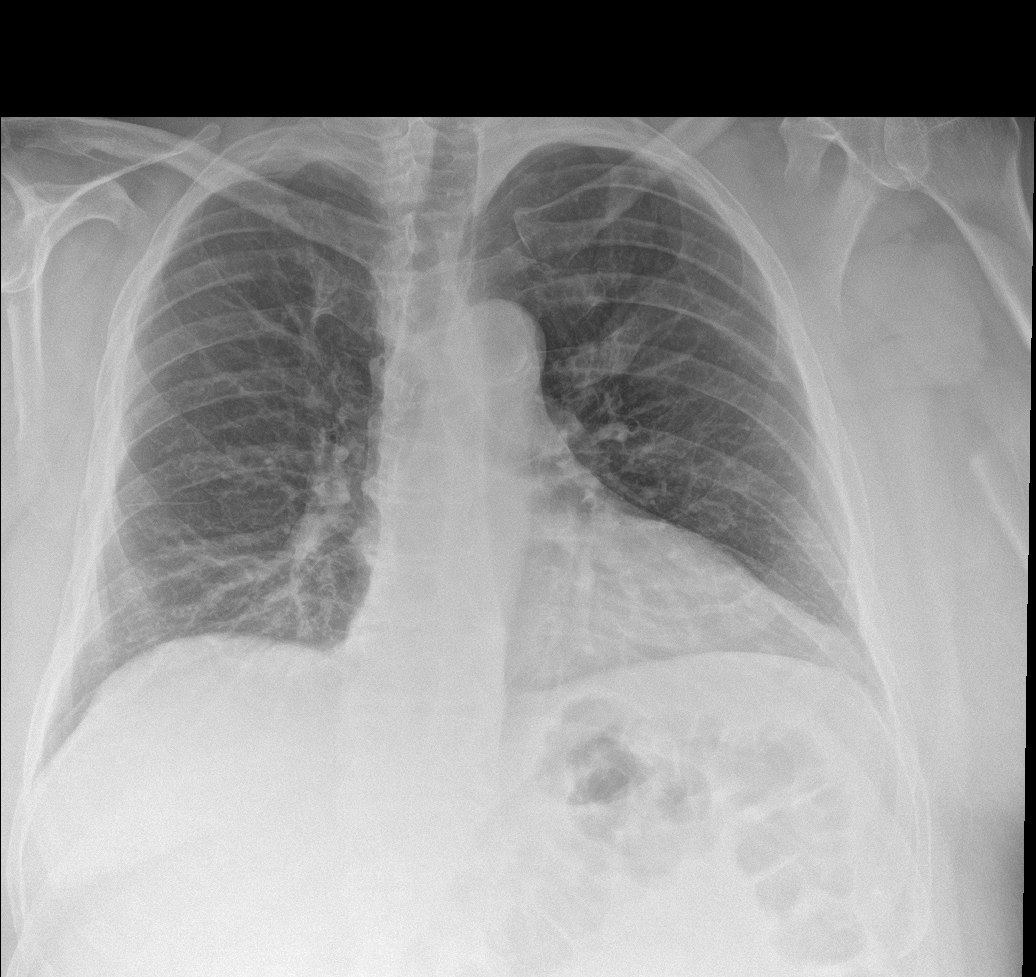

[rib pa obl (1 of 3)]
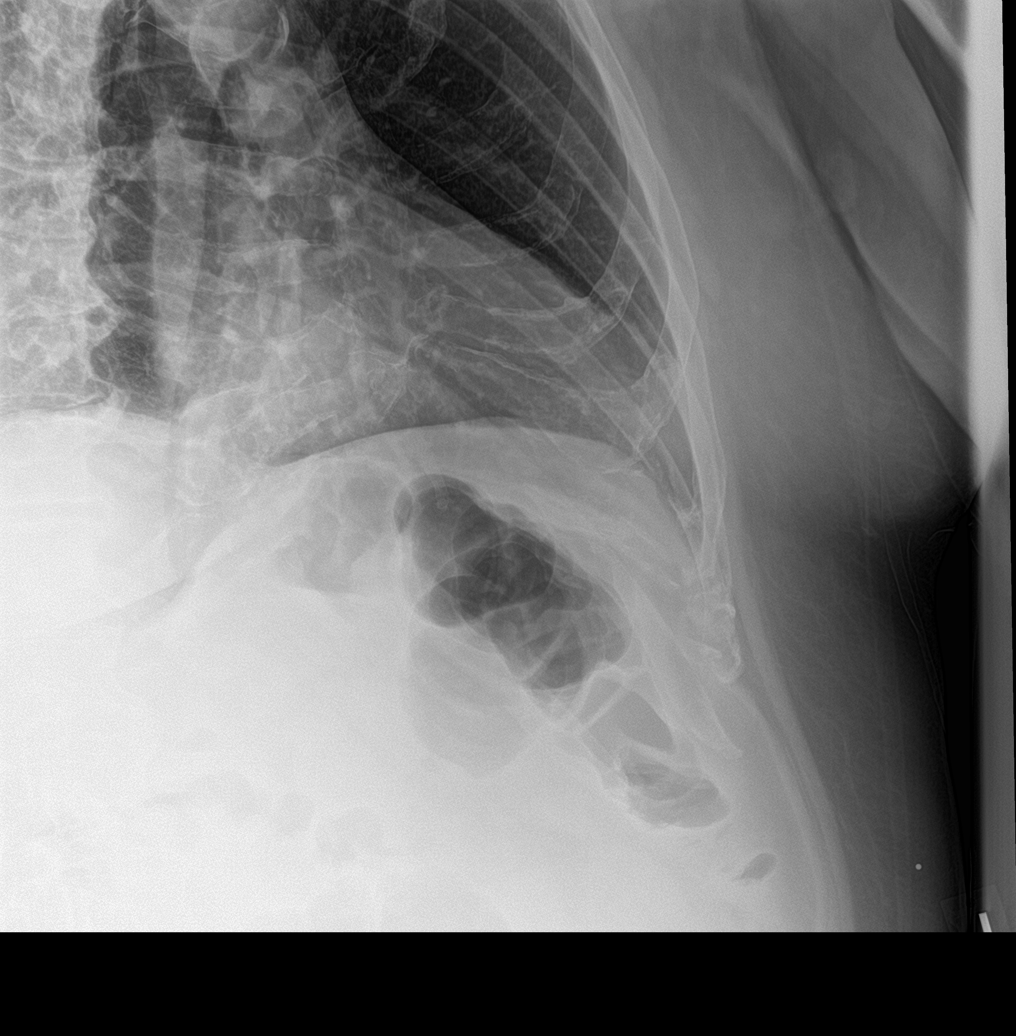

[rib pa obl (2 of 3)]
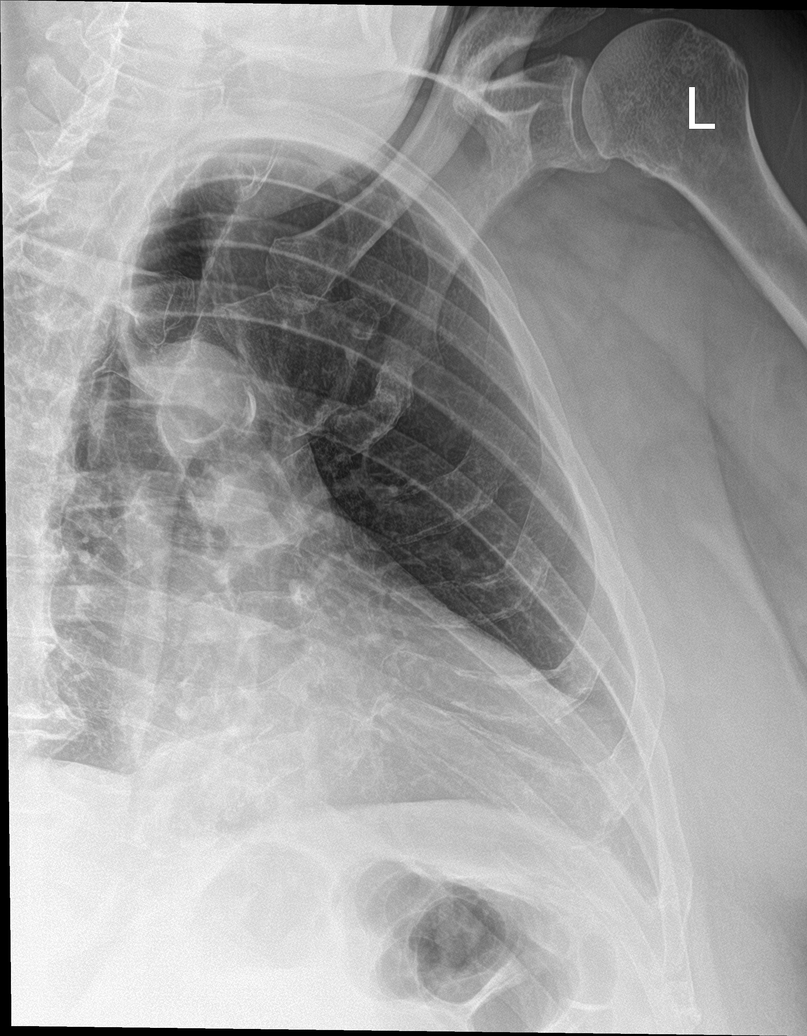

[rib pa (1 of 2)]
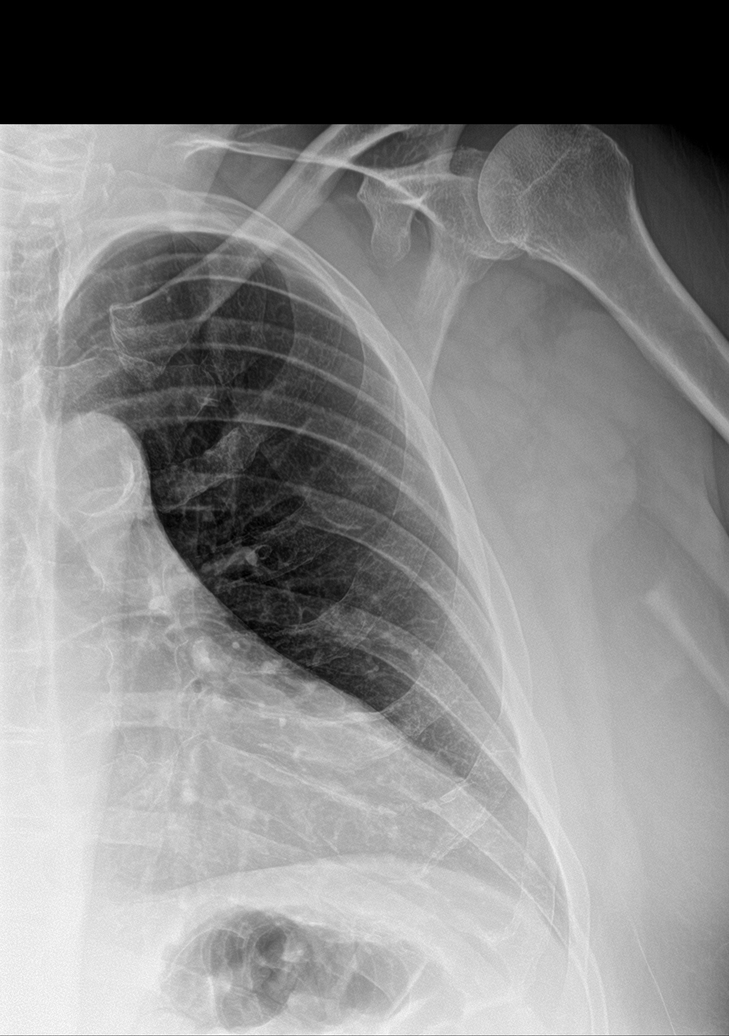

[rib pa (2 of 2)]
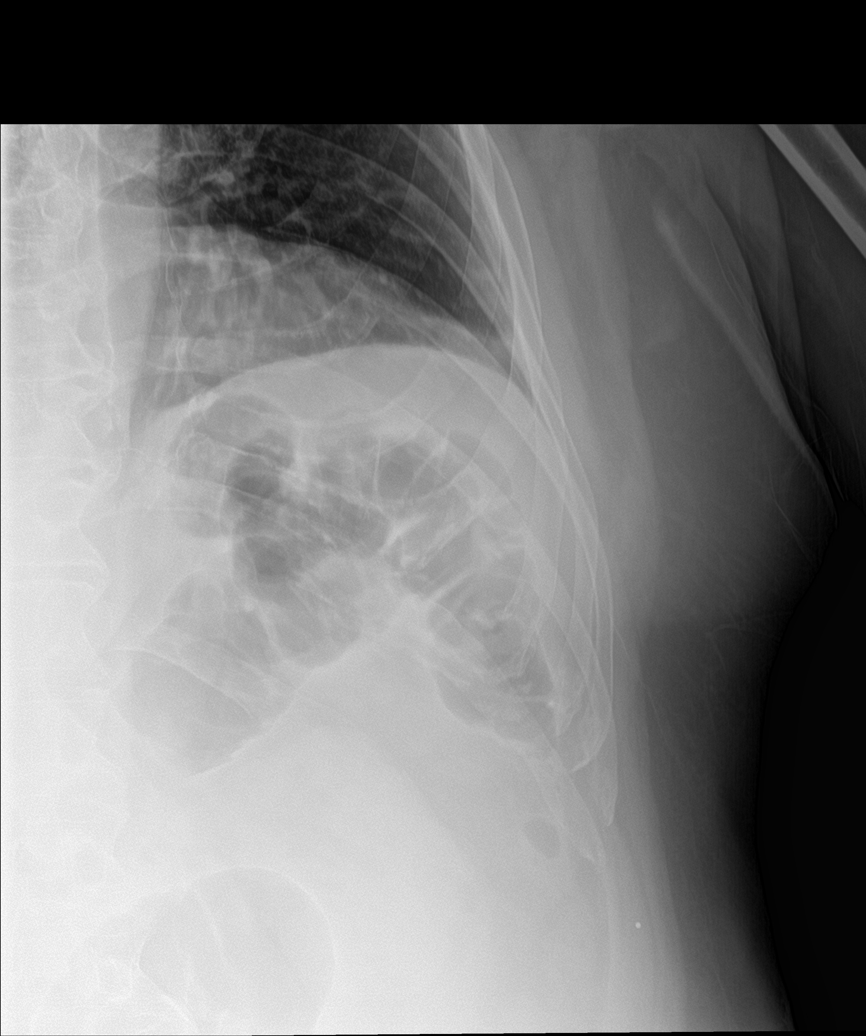

[rib pa obl (3 of 3)]
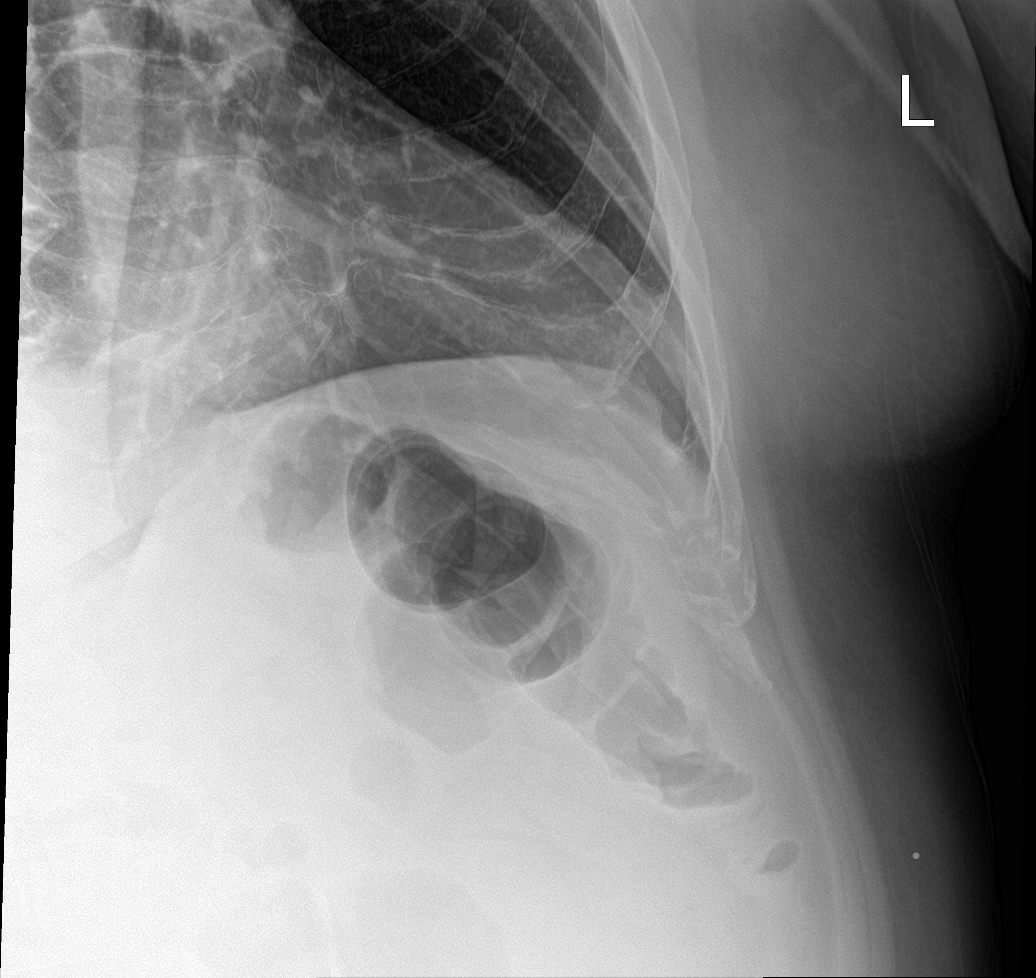

[6 of 6 positions shown; findings below may reference images not displayed]

FINDINGS: No fracture or other bone lesions are seen involving the ribs. There
is no evidence of pneumothorax or pleural effusion. Both lungs are
clear. Heart size and mediastinal contours are within normal limits.
IMPRESSION: No acute rib fracture is noted.  No pneumothorax is seen.

## 2020-12-29 IMAGING — CT CT ELBOW*L* W/O CM
3 of 4 series · 16 of 28 positions shown, 18 images · non-contrast
Comparison: Same-day radiograph

CLINICAL DATA: Left elbow fracture

EXAM:
CT OF THE UPPER LEFT EXTREMITY WITHOUT CONTRAST
TECHNIQUE: Multidetector CT imaging of the upper left extremity was performed
according to the standard protocol.

[Series 4: axial bone · axial · 0.37mm/px · z∈[+1150,+1240]mm · 5 of 92 slices shown]
[im 16/92  bone]
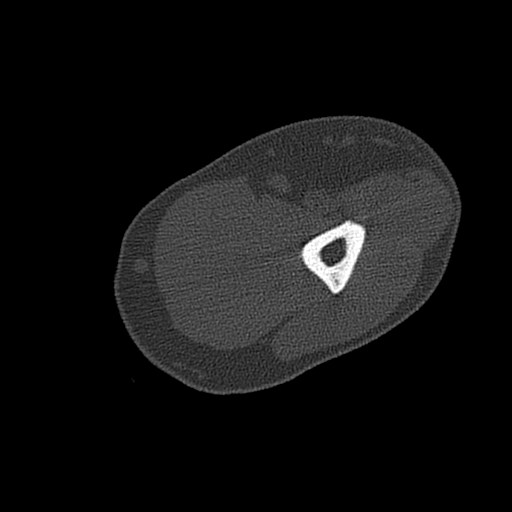
[im 31/92  bone]
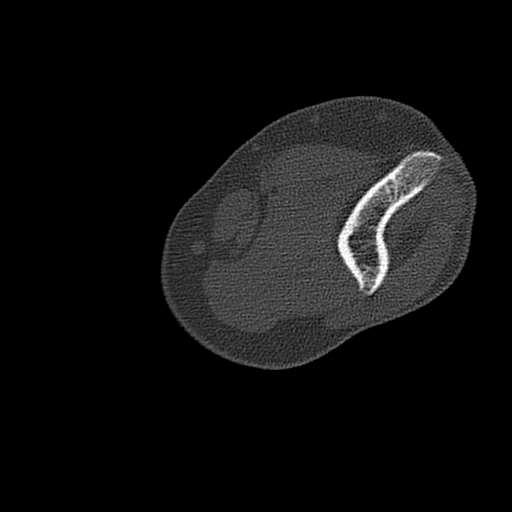
[im 46/92  bone]
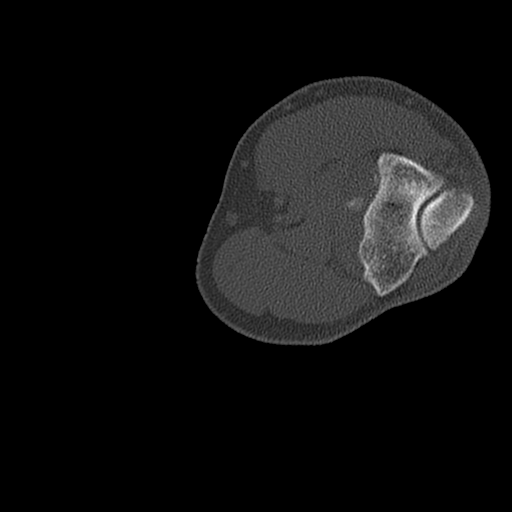
[im 61/92  bone]
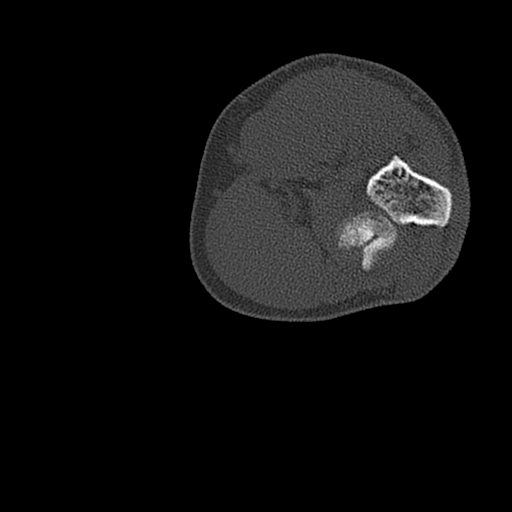
[im 76/92  bone]
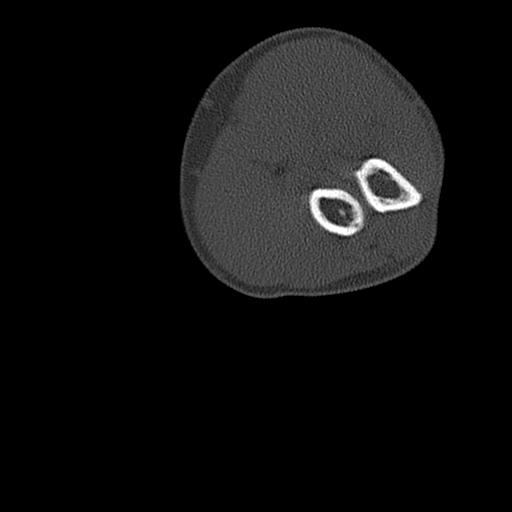

[Series 8: axial st · axial · 0.27mm/px · z∈[+1129,+1232]mm · 5 of 106 slices shown, 7 images]
[im 18/106  soft-tissue]
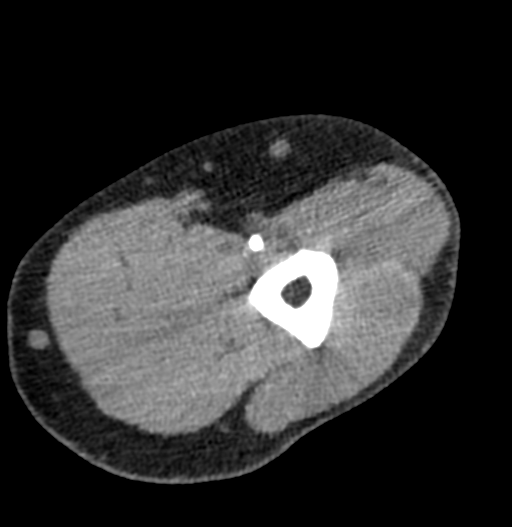
[im 18/106  bone]
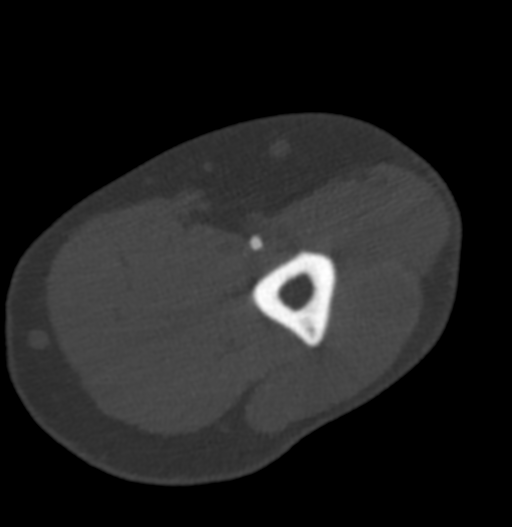
[im 36/106  bone]
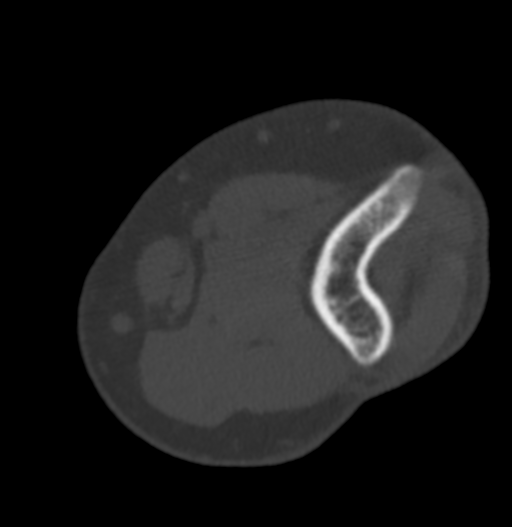
[im 53/106  bone]
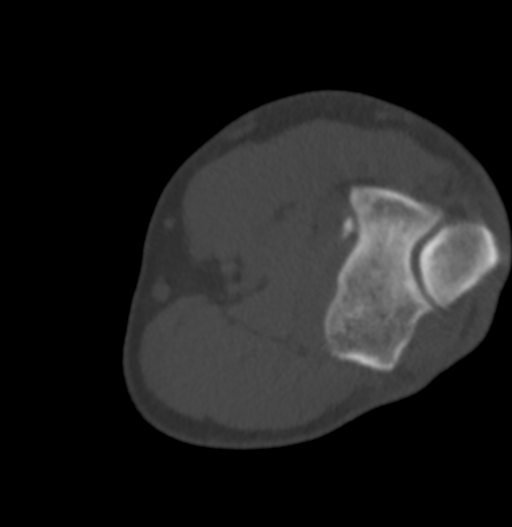
[im 71/106  bone]
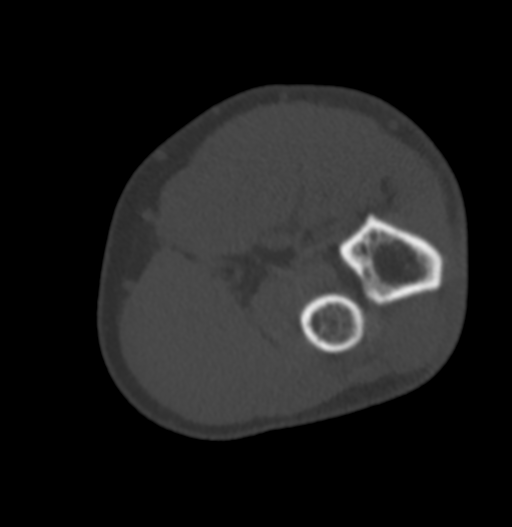
[im 88/106  soft-tissue]
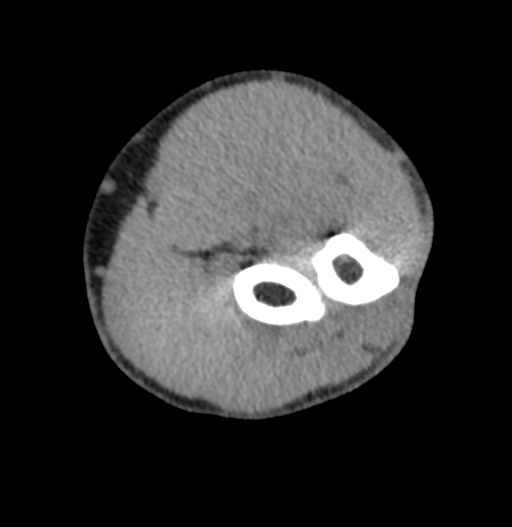
[im 88/106  bone]
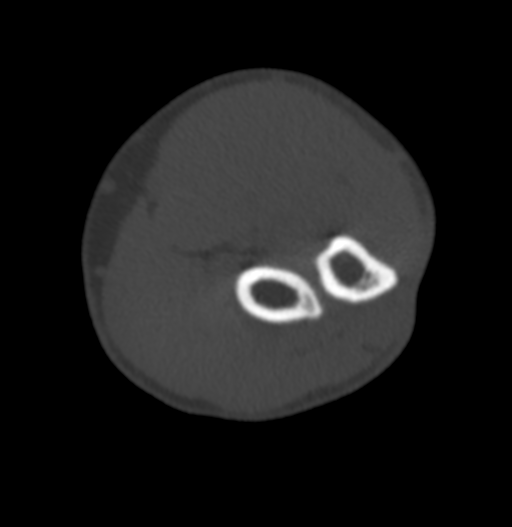

[Series 10: sag st · sagittal · 0.24mm/px · 6 of 83 slices shown]
[im 14/83  bone]
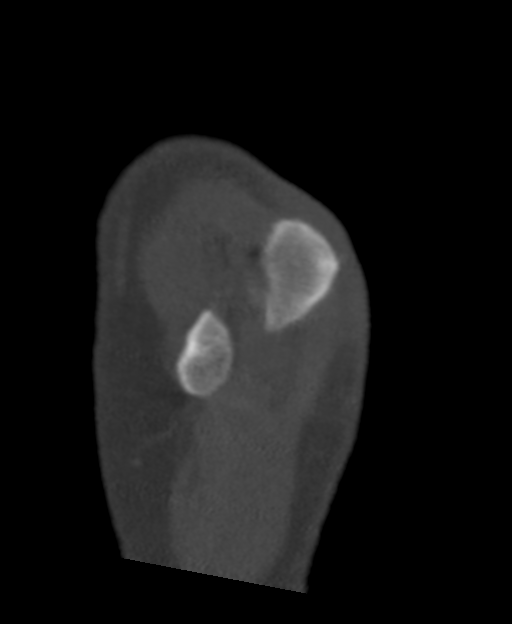
[im 28/83  bone]
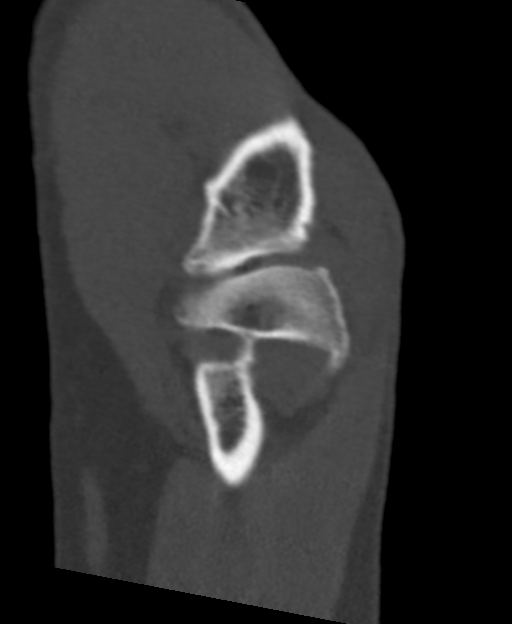
[im 42/83  bone]
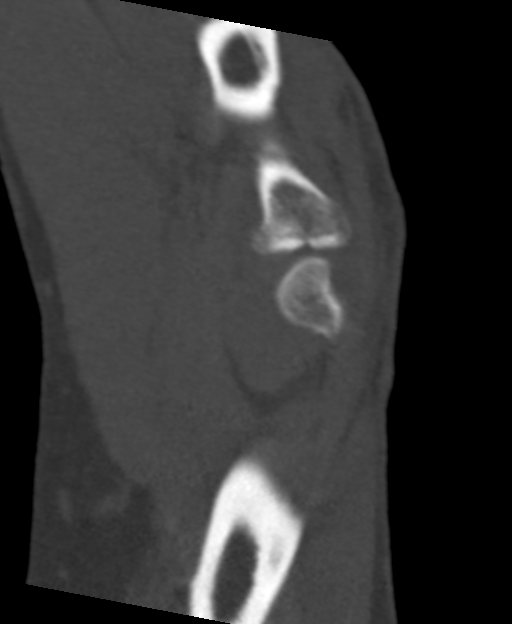
[im 52/83  soft-tissue]
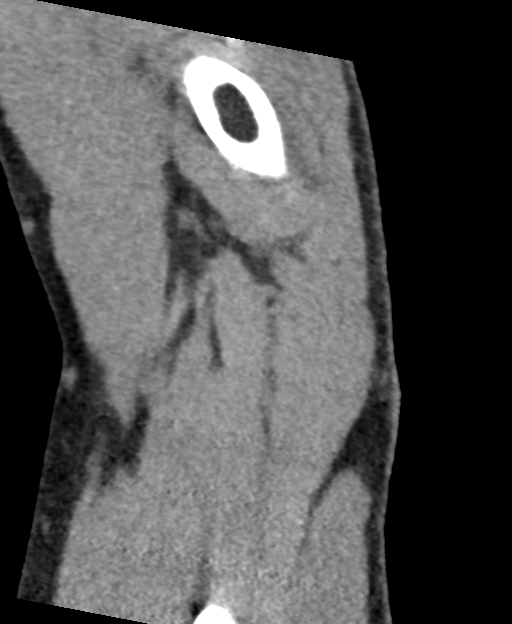
[im 55/83  bone]
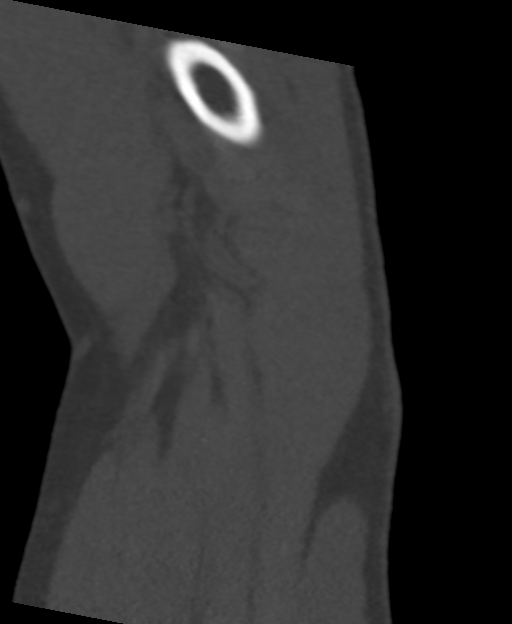
[im 69/83  bone]
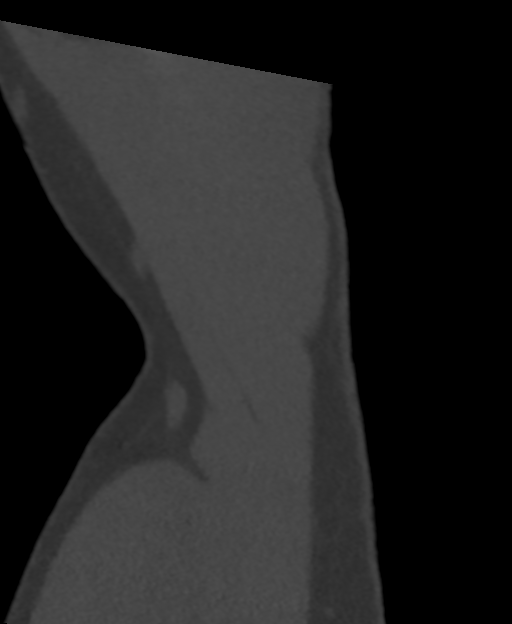

[16 of 28 positions shown; findings below may reference images not displayed]

FINDINGS: Bones/Joint/Cartilage

There is a comminuted radial head fracture including a displaced
fracture fragment which appears positioned anterior to the
capitellum within the anterior joint capsule. Additional fracture
extension into the proximal radioulnar joint and into the location
of the annular ligament. Additional minimally displaced fractures
versus spurring of both the coronoid process and posterior
olecranon. Remaining osseous structures are intact and normally
aligned. Moderate elbow joint effusion with lipohemarthrosis.

Ligaments

Suboptimally assessed by CT.

Muscles and Tendons

No intramuscular hemorrhage, musculotendinous injury or retraction
is evident.

Soft tissues

Mild soft tissue swelling of the elbow is most pronounced
posteriorly. No soft tissue gas or foreign body.
IMPRESSION: 1. Comminuted radial head fracture. Fracture components include a
displaced fragment anterior to the capitellum within the anterior
joint capsule. Fracture lines extend into the proximal radioulnar
joint and annular ligament.
2. Additional minimally displaced fractures versus spurring of both
the coronoid process and summit of the posterior olecranon.
3. Moderate elbow joint effusion with lipohemarthrosis.

## 2022-01-24 DIAGNOSIS — L820 Inflamed seborrheic keratosis: Secondary | ICD-10-CM | POA: Diagnosis not present

## 2022-01-24 DIAGNOSIS — D485 Neoplasm of uncertain behavior of skin: Secondary | ICD-10-CM | POA: Diagnosis not present

## 2022-01-24 DIAGNOSIS — L821 Other seborrheic keratosis: Secondary | ICD-10-CM | POA: Diagnosis not present

## 2022-10-16 DIAGNOSIS — S83422A Sprain of lateral collateral ligament of left knee, initial encounter: Secondary | ICD-10-CM | POA: Diagnosis not present

## 2022-10-16 DIAGNOSIS — M1712 Unilateral primary osteoarthritis, left knee: Secondary | ICD-10-CM | POA: Diagnosis not present

## 2022-10-16 DIAGNOSIS — I10 Essential (primary) hypertension: Secondary | ICD-10-CM | POA: Diagnosis not present

## 2022-10-19 DIAGNOSIS — M25562 Pain in left knee: Secondary | ICD-10-CM | POA: Diagnosis not present

## 2022-11-29 DIAGNOSIS — I10 Essential (primary) hypertension: Secondary | ICD-10-CM | POA: Diagnosis not present

## 2022-11-29 DIAGNOSIS — E6609 Other obesity due to excess calories: Secondary | ICD-10-CM | POA: Diagnosis not present

## 2022-11-29 DIAGNOSIS — Z6839 Body mass index (BMI) 39.0-39.9, adult: Secondary | ICD-10-CM | POA: Diagnosis not present

## 2022-11-29 DIAGNOSIS — Z1331 Encounter for screening for depression: Secondary | ICD-10-CM | POA: Diagnosis not present

## 2022-11-29 DIAGNOSIS — Z0000 Encounter for general adult medical examination without abnormal findings: Secondary | ICD-10-CM | POA: Diagnosis not present

## 2022-11-29 DIAGNOSIS — E1165 Type 2 diabetes mellitus with hyperglycemia: Secondary | ICD-10-CM | POA: Diagnosis not present

## 2023-01-23 DIAGNOSIS — B079 Viral wart, unspecified: Secondary | ICD-10-CM | POA: Diagnosis not present

## 2023-01-23 DIAGNOSIS — D485 Neoplasm of uncertain behavior of skin: Secondary | ICD-10-CM | POA: Diagnosis not present

## 2023-01-23 DIAGNOSIS — Z1283 Encounter for screening for malignant neoplasm of skin: Secondary | ICD-10-CM | POA: Diagnosis not present

## 2023-01-25 DIAGNOSIS — E1165 Type 2 diabetes mellitus with hyperglycemia: Secondary | ICD-10-CM | POA: Diagnosis not present

## 2023-01-25 DIAGNOSIS — R7309 Other abnormal glucose: Secondary | ICD-10-CM | POA: Diagnosis not present

## 2023-01-25 DIAGNOSIS — Z6837 Body mass index (BMI) 37.0-37.9, adult: Secondary | ICD-10-CM | POA: Diagnosis not present

## 2023-02-05 ENCOUNTER — Encounter: Payer: Self-pay | Admitting: Nutrition

## 2023-02-05 ENCOUNTER — Encounter: Payer: PPO | Attending: Family Medicine | Admitting: Nutrition

## 2023-02-05 VITALS — Ht 69.0 in | Wt 256.0 lb

## 2023-02-05 DIAGNOSIS — I10 Essential (primary) hypertension: Secondary | ICD-10-CM | POA: Insufficient documentation

## 2023-02-05 DIAGNOSIS — E118 Type 2 diabetes mellitus with unspecified complications: Secondary | ICD-10-CM | POA: Diagnosis not present

## 2023-02-05 DIAGNOSIS — Z6837 Body mass index (BMI) 37.0-37.9, adult: Secondary | ICD-10-CM | POA: Insufficient documentation

## 2023-02-05 DIAGNOSIS — E669 Obesity, unspecified: Secondary | ICD-10-CM | POA: Insufficient documentation

## 2023-02-05 NOTE — Patient Instructions (Addendum)
Goals  Drink 5 bottles of water per day or more Eat 1 serving with meals. Increase fruits, vegetables and whole grains. Test blood sugars 3 times per week ; some in am and some before bed. Walk 30 minutes  of exercise a day Eats meals on time Get A1C to 6.5% or below.

## 2023-02-05 NOTE — Progress Notes (Signed)
Medical Nutrition Therapy  Appointment Start time:  1030  Appointment End time:  1200  Primary concerns today: DM Type 2, Obesity, HTN  Referral diagnosis: E11.8, E66.9, I10. Preferred learning style: No preference  Learning readiness: Ready   NUTRITION ASSESSMENT  67 yr old male here with his wife who was referred for Type 2 DM, Obesity, HTN. He was recently diagnosed with Type 2 DM in April 2024. A1C was reported to be >10%. He notes his most recent A1C was between 7-8%.   PCP Dr. Phillips Odor. PMH: Type 2 DM, Obesity and HTN.  He currently is taking Jardiance and Metformin 1000 mg BID. He is not testing blood sugars. He doesn't have a glucometer. Was 285 lbs in March 2024.  History of diet with simple sugars of Sunny D juice, sodas, sweets and a lot of processed foods.  Since being diagnosed, he has cut out juices and now drinking diet sundrop. Has cut down on portions and trying to get more vegetables.  He was told to avoid eating fruit due to sugar content and avoid 'white' foods. He lost 35 lbs. Drinking diet Sundrop. Eats 2-3 meals per day.  He is willing to work with Lifestyle Medicine to adopt a more whole plant based diet to help improve and reverse his DM, Obesity and HTN.  Anthropometrics  Wt Readings from Last 3 Encounters:  02/05/23 256 lb (116.1 kg)  10/22/19 270 lb 8 oz (122.7 kg)  10/22/19 270 lb (122.5 kg)   Ht Readings from Last 3 Encounters:  02/05/23 5\' 9"  (1.753 m)  10/22/19 5\' 11"  (1.803 m)  10/22/19 5\' 11"  (1.803 m)   Body mass index is 37.8 kg/m. @BMIFA @ Facility age limit for growth %iles is 20 years. Facility age limit for growth %iles is 20 years.    Clinical Medical Hx: See chart Medications: Jardiance, Metformin 1000 mg BID Labs:  Notable Signs/Symptoms: Increased thirst, frequent urination, blurry vision  Lifestyle & Dietary Hx Lives with his wife. Is retired. Wife is supportive.  Estimated daily fluid intake: 60 oz Supplements:  Sleep:  6-8 hrs Stress / self-care: no problems Current average weekly physical activity: active outdoors. Was walking some  24-Hr Dietary Recall Eats 2-3 meals per day. Wife has been cooking a lot of fried foods. He is drinking diet sodas, some water Has been trying to eat more vegetables and cut down on potatoes  Estimated Energy Needs Calories: 1800 Carbohydrate: 200g Protein: 135g Fat: 50g   NUTRITION DIAGNOSIS  NI-1.7 Predicted excessive energy intake As related to Type 2 DM, OBesity and HTN.  As evidenced by AA`C > 7%, BMI 37 and on antihypertensive medication.Marland Kitchen   NUTRITION INTERVENTION  Nutrition education (E-1) on the following topics:  Nutrition and Diabetes education provided on My Plate, CHO counting, meal planning, portion sizes, timing of meals, avoiding snacks between meals unless having a low blood sugar, target ranges for A1C and blood sugars, signs/symptoms and treatment of hyper/hypoglycemia, monitoring blood sugars, taking medications as prescribed, benefits of exercising 30 minutes per day and prevention of complications of DM.  Lifestyle Medicine  - Whole Food, Plant Predominant Nutrition is highly recommended: Eat Plenty of vegetables, Mushrooms, fruits, Legumes, Whole Grains, Nuts, seeds in lieu of processed meats, processed snacks/pastries red meat, poultry, eggs.    -It is better to avoid simple carbohydrates including: Cakes, Sweet Desserts, Ice Cream, Soda (diet and regular), Sweet Tea, Candies, Chips, Cookies, Store Bought Juices, Alcohol in Excess of  1-2 drinks a day, Lemonade,  Artificial Sweeteners, Doughnuts, Coffee Creamers, "Sugar-free" Products, etc, etc.  This is not a complete list.....  Exercise: If you are able: 30 -60 minutes a day ,4 days a week, or 150 minutes a week.  The longer the better.  Combine stretch, strength, and aerobic activities.  If you were told in the past that you have high risk for cardiovascular diseases, you may seek evaluation by  your heart doctor prior to initiating moderate to intense exercise programs.   Handouts Provided Include  Lifestyle Medicine  Learning Style & Readiness for Change Teaching method utilized: Visual & Auditory  Demonstrated degree of understanding via: Teach Back  Barriers to learning/adherence to lifestyle change: None  Goals Established by Pt Goals  Drink 5 bottles of water per day or more Eat 1 serving with meals. Increase fruits, vegetables and whole grains. Test blood sugars 3 times per week ; some in am and some before bed. Walk 30 minutes  of exercise a day Eats meals on time Get A1C to 6.5% or below.   MONITORING & EVALUATION Dietary intake, weekly physical activity, and blood sugars in 1 month.  He needs a prescription for a meter and testing supplies.  Next Steps  Patient is to work on better meal planning and meal prepping.Marland Kitchen

## 2023-03-05 ENCOUNTER — Encounter: Payer: PPO | Attending: Family Medicine | Admitting: Nutrition

## 2023-03-05 VITALS — Ht 69.0 in | Wt 253.0 lb

## 2023-03-05 DIAGNOSIS — Z6837 Body mass index (BMI) 37.0-37.9, adult: Secondary | ICD-10-CM

## 2023-03-05 DIAGNOSIS — E669 Obesity, unspecified: Secondary | ICD-10-CM | POA: Diagnosis not present

## 2023-03-05 DIAGNOSIS — E118 Type 2 diabetes mellitus with unspecified complications: Secondary | ICD-10-CM | POA: Diagnosis not present

## 2023-03-05 DIAGNOSIS — I10 Essential (primary) hypertension: Secondary | ICD-10-CM | POA: Diagnosis not present

## 2023-03-05 NOTE — Progress Notes (Unsigned)
Medical Nutrition Therapy  Appointment Start time:  47  Appointment End time: 28  Primary concerns today: DM Type 2, Obesity, HTN  Referral diagnosis: E11.8, E66.9, I10. Preferred learning style: No preference  Learning readiness: Ready   NUTRITION ASSESSMENT Follow upo Has been eating more fruits, and vegetables.  Vision is better, Less urination and less thirsty. Walking some for exercise. His wife still gets fast food from time to time. Drinks 2 bottles of water per day and still drinks Diet Sundrop. Didn't buy a meter. Isn't testing blood sugars Goes back to PCP in October 2024. Lost 3 lbs. Still taking Jardiance and Metformin 1000 mg BID.  Anthropometrics  Wt Readings from Last 3 Encounters:  02/05/23 256 lb (116.1 kg)  10/22/19 270 lb 8 oz (122.7 kg)  10/22/19 270 lb (122.5 kg)   Ht Readings from Last 3 Encounters:  02/05/23 5\' 9"  (1.753 m)  10/22/19 5\' 11"  (1.803 m)  10/22/19 5\' 11"  (1.803 m)   There is no height or weight on file to calculate BMI. @BMIFA @ Facility age limit for growth %iles is 20 years. Facility age limit for growth %iles is 20 years.    Clinical Medical Hx: See chart Medications: Jardiance, Metformin 1000 mg BID Labs: Notable Signs/Symptoms: Increased thirst, frequent urination, blurry vision  Lifestyle & Dietary Hx Lives with his wife. Is retired. Wife is supportive.  Estimated daily fluid intake: 60 oz Supplements:  Sleep: 6-8 hrs Stress / self-care: no problems Current average weekly physical activity: active outdoors. Was walking some  24-Hr Dietary Recall B) Cherrrios or grits with eggs, water or diet sundrop L0 Skipped; apple D) Zucchini and fish sticks.  Water   Estimated Energy Needs Calories: 1800 Carbohydrate: 200g Protein: 135g Fat: 50g   NUTRITION DIAGNOSIS  NI-1.7 Predicted excessive energy intake As related to Type 2 DM, OBesity and HTN.  As evidenced by AA`C > 7%, BMI 37 and on antihypertensive  medication.Marland Kitchen   NUTRITION INTERVENTION  Nutrition education (E-1) on the following topics:  Nutrition and Diabetes education provided on My Plate, CHO counting, meal planning, portion sizes, timing of meals, avoiding snacks between meals unless having a low blood sugar, target ranges for A1C and blood sugars, signs/symptoms and treatment of hyper/hypoglycemia, monitoring blood sugars, taking medications as prescribed, benefits of exercising 30 minutes per day and prevention of complications of DM.  Lifestyle Medicine  - Whole Food, Plant Predominant Nutrition is highly recommended: Eat Plenty of vegetables, Mushrooms, fruits, Legumes, Whole Grains, Nuts, seeds in lieu of processed meats, processed snacks/pastries red meat, poultry, eggs.    -It is better to avoid simple carbohydrates including: Cakes, Sweet Desserts, Ice Cream, Soda (diet and regular), Sweet Tea, Candies, Chips, Cookies, Store Bought Juices, Alcohol in Excess of  1-2 drinks a day, Lemonade,  Artificial Sweeteners, Doughnuts, Coffee Creamers, "Sugar-free" Products, etc, etc.  This is not a complete list.....  Exercise: If you are able: 30 -60 minutes a day ,4 days a week, or 150 minutes a week.  The longer the better.  Combine stretch, strength, and aerobic activities.  If you were told in the past that you have high risk for cardiovascular diseases, you may seek evaluation by your heart doctor prior to initiating moderate to intense exercise programs.   Handouts Provided Include  Lifestyle Medicine  Learning Style & Readiness for Change Teaching method utilized: Visual & Auditory  Demonstrated degree of understanding via: Teach Back  Barriers to learning/adherence to lifestyle change: None  Goals Established by  Pt Goals  Increase walking to 45 minutes a day Don't skip meals Aim for 5 bottles of water per day Cut out diet soda Lose 2 lbs per month Get A1C down to 5.7%   MONITORING & EVALUATION Dietary intake, weekly  physical activity, and blood sugars in 1 month.  He needs a prescription for a meter and testing supplies.  Next Steps  Patient is to work on better meal planning and meal prepping.Marland Kitchen

## 2023-03-05 NOTE — Patient Instructions (Signed)
Increase walking to 45 minutes a day Don't skip meals Aim for 5 bottles of water per day Cut out diet soda Lose 2 lbs per month Get A1C down to 5.7%

## 2023-03-07 ENCOUNTER — Encounter: Payer: Self-pay | Admitting: Nutrition

## 2023-04-29 DIAGNOSIS — E781 Pure hyperglyceridemia: Secondary | ICD-10-CM | POA: Diagnosis not present

## 2023-04-29 DIAGNOSIS — I10 Essential (primary) hypertension: Secondary | ICD-10-CM | POA: Diagnosis not present

## 2023-04-29 DIAGNOSIS — Z6836 Body mass index (BMI) 36.0-36.9, adult: Secondary | ICD-10-CM | POA: Diagnosis not present

## 2023-04-29 DIAGNOSIS — E6609 Other obesity due to excess calories: Secondary | ICD-10-CM | POA: Diagnosis not present

## 2023-04-29 DIAGNOSIS — E1165 Type 2 diabetes mellitus with hyperglycemia: Secondary | ICD-10-CM | POA: Diagnosis not present

## 2023-10-30 DIAGNOSIS — Z1331 Encounter for screening for depression: Secondary | ICD-10-CM | POA: Diagnosis not present

## 2023-10-30 DIAGNOSIS — Z6837 Body mass index (BMI) 37.0-37.9, adult: Secondary | ICD-10-CM | POA: Diagnosis not present

## 2023-10-30 DIAGNOSIS — E781 Pure hyperglyceridemia: Secondary | ICD-10-CM | POA: Diagnosis not present

## 2023-10-30 DIAGNOSIS — I10 Essential (primary) hypertension: Secondary | ICD-10-CM | POA: Diagnosis not present

## 2023-10-30 DIAGNOSIS — Z0001 Encounter for general adult medical examination with abnormal findings: Secondary | ICD-10-CM | POA: Diagnosis not present

## 2023-10-30 DIAGNOSIS — E6609 Other obesity due to excess calories: Secondary | ICD-10-CM | POA: Diagnosis not present

## 2023-10-30 DIAGNOSIS — E1165 Type 2 diabetes mellitus with hyperglycemia: Secondary | ICD-10-CM | POA: Diagnosis not present

## 2024-01-02 DIAGNOSIS — I10 Essential (primary) hypertension: Secondary | ICD-10-CM | POA: Diagnosis not present

## 2024-01-02 DIAGNOSIS — M79644 Pain in right finger(s): Secondary | ICD-10-CM | POA: Diagnosis not present

## 2024-01-02 DIAGNOSIS — Z7689 Persons encountering health services in other specified circumstances: Secondary | ICD-10-CM | POA: Diagnosis not present

## 2024-01-02 DIAGNOSIS — E119 Type 2 diabetes mellitus without complications: Secondary | ICD-10-CM | POA: Diagnosis not present

## 2024-01-09 DIAGNOSIS — Z1211 Encounter for screening for malignant neoplasm of colon: Secondary | ICD-10-CM | POA: Diagnosis not present

## 2024-01-13 DIAGNOSIS — Z7689 Persons encountering health services in other specified circumstances: Secondary | ICD-10-CM | POA: Diagnosis not present

## 2024-01-13 DIAGNOSIS — E119 Type 2 diabetes mellitus without complications: Secondary | ICD-10-CM | POA: Diagnosis not present

## 2024-01-13 DIAGNOSIS — I10 Essential (primary) hypertension: Secondary | ICD-10-CM | POA: Diagnosis not present

## 2024-01-16 DIAGNOSIS — Z0000 Encounter for general adult medical examination without abnormal findings: Secondary | ICD-10-CM | POA: Diagnosis not present

## 2024-01-16 DIAGNOSIS — E119 Type 2 diabetes mellitus without complications: Secondary | ICD-10-CM | POA: Diagnosis not present

## 2024-01-16 DIAGNOSIS — I10 Essential (primary) hypertension: Secondary | ICD-10-CM | POA: Diagnosis not present

## 2024-01-16 LAB — COLOGUARD: COLOGUARD: POSITIVE — AB

## 2024-01-29 ENCOUNTER — Encounter: Payer: Self-pay | Admitting: Gastroenterology

## 2024-02-01 NOTE — H&P (View-Only) (Signed)
 Referring Provider: Shona Norleen PEDLAR, MD Primary Care Physician:  Shona Norleen PEDLAR, MD Primary Gastroenterologist:  Dr. Cindie  Chief Complaint  Patient presents with   positive cologuard     Positive cologuard. Time for a colonoscopy. Never had one     HPI:   Anthony Frederick is a 68 y.o. male presenting today at the request of Shona, Norleen PEDLAR, MD for positive cologuard.   Cologuard positive 01/09/24.  Hgb 15.2 on 01/14/24.   Today, patient reports he is doing well. Denies brbpr, melena, unintentional weight loss, change in bowel habits, constipation, diarrhea, abdominal pain.   No prior colonoscopy. No family history of colon cancer.  No other GI concern such as heartburn, dysphagia, nausea, vomiting.   Past Medical History:  Diagnosis Date   Arthritis    Hernia of abdominal wall    History of kidney stones    Hypertension    Left ureteral calculus    Type 2 diabetes mellitus (HCC)     Past Surgical History:  Procedure Laterality Date   ANKLE FRACTURE SURGERY Left    CYSTOSCOPY W/ RETROGRADES Left 04/19/2014   Procedure: CYSTOSCOPY WITH RETROGRADE PYELOGRAM/ INTERPRETATION;  Surgeon: Arlena LILLETTE Gal, MD;  Location: [facility];  Service: Urology;  Laterality: Left;   CYSTOSCOPY WITH STENT PLACEMENT Left 04/19/2014   Procedure: CYSTOSCOPY WITH STENT PLACEMENT;  Surgeon: Arlena LILLETTE Gal, MD;  Location: [facility];  Service: Urology;  Laterality: Left;   CYSTOSCOPY WITH URETEROSCOPY Left 04/19/2014   Procedure: CYSTOSCOPY WITH URETEROSCOPY/ BASKET EXTRACTION;  Surgeon: Arlena LILLETTE Gal, MD;  Location: [facility];  Service: Urology;  Laterality: Left;   RADIAL HEAD ARTHROPLASTY Left 10/27/2019   Procedure: LEFT RADIAL HEAD ARTHROPLASTY;  Surgeon: Josefina Chew, MD;  Location: [facility];  Service: Orthopedics;  Laterality: Left;   removal axillary cyst Left    right knee replacement     UMBILICAL HERNIA REPAIR N/A 08/19/2017    Procedure: HERNIA REPAIR UMBILICAL ADULT WITH MESH;  Surgeon: Kallie Manuelita BROCKS, MD;  Location: [facility];  Service: General;  Laterality: N/A;    Current Outpatient Medications  Medication Sig Dispense Refill   acetaminophen  (TYLENOL ) 500 MG tablet Take 1 tablet (500 mg total) by mouth every 6 (six) hours as needed for mild pain or headache. 30 tablet 0   diltiazem (TIAZAC) 300 MG 24 hr capsule Take 300 mg by mouth daily.     JANUVIA 100 MG tablet Take 100 mg by mouth daily.     losartan (COZAAR) 50 MG tablet Take 50 mg by mouth daily.      metFORMIN (GLUCOPHAGE) 1000 MG tablet Take 1,000 mg by mouth 2 (two) times daily.     No current facility-administered medications for this visit.    Allergies as of 02/03/2024 - Review Complete 02/03/2024  Allergen Reaction Noted   Levaquin [levofloxacin] Rash 10/21/2019    Family History  Problem Relation Age of Onset   Diabetes Mother    Melanoma Father    Colon cancer Neg Hx     Social History   Socioeconomic History   Marital status: Married    Spouse name: Not on file   Number of children: 1   Years of education: 12   Highest education level: Not on file  Occupational History   Not on file  Tobacco Use   Smoking status: Former    Current packs/day: 0.00    Types: Cigarettes    Quit date:  07/09/1958    Years since quitting: 65.6   Smokeless tobacco: Never  Vaping Use   Vaping status: Never Used  Substance and Sexual Activity   Alcohol use: No   Drug use: No   Sexual activity: Yes    Birth control/protection: None  Other Topics Concern   Not on file  Social History Narrative   Not on file   Social Drivers of Health   Financial Resource Strain: Not on file  Food Insecurity: Not on file  Transportation Needs: Not on file  Physical Activity: Not on file  Stress: Not on file  Social Connections: Not on file  Intimate Partner Violence: Not on file    Review of Systems: Gen: Denies any fever, chills, cold or flulike  symptoms, presyncope, syncope. CV: Denies chest pain, heart palpitations. Resp: Denies shortness of breath, cough. GI: See HPI GU : Denies urinary burning, urinary frequency, urinary hesitancy MS: Denies joint pain. Derm: Denies rash. Psych: Denies depression, anxiety. Heme: See HPI  Physical Exam: BP 137/77 (BP Location: Right Arm, Patient Position: Sitting, Cuff Size: Large)   Pulse 62   Temp 97.9 F (36.6 C) (Temporal)   Ht 5' 11 (1.803 m)   Wt 264 lb 9.6 oz (120 kg)   BMI 36.90 kg/m  General:   Alert and oriented. Pleasant and cooperative. Well-nourished and well-developed.  Head:  Normocephalic and atraumatic. Eyes:  Without icterus, sclera clear and conjunctiva pink.  Ears:  Normal auditory acuity. Lungs:  Clear to auscultation bilaterally. No wheezes, rales, or rhonchi. No distress.  Heart:  S1, S2 present without murmurs appreciated.  Abdomen:  +BS, soft, non-tender and non-distended. No HSM noted. No guarding or rebound. No masses appreciated.  Rectal:  Deferred  Msk:  Symmetrical without gross deformities. Normal posture. Extremities:  Without edema. Neurologic:  Alert and  oriented x4;  grossly normal neurologically. Skin:  Intact without significant lesions or rashes. Psych:  Normal mood and affect.    Assessment:  69 year old male with history of HTN, type 2 diabetes, presenting today to discuss scheduling colonoscopy due to positive Cologuard completed 01/09/2024.  He denies any significant GI symptoms.  No alarm symptoms.  Hemoglobin 15.2 on 01/14/2024.  He has never had a colonoscopy.  No family history of colon cancer.   Plan:  Proceed with colonoscopy with propofol  by Dr. Cindie in near future. The risks, benefits, and alternatives have been discussed with the patient in detail. The patient states understanding and desires to proceed.  ASA 2 1 day prior: One half dose of metformin in the morning and evening, Januvia as prescribed Day of: No morning diabetes  medications. Follow-up as needed.   Josette Centers, PA-C [facility] 02/03/2024

## 2024-02-01 NOTE — Progress Notes (Unsigned)
 Referring Provider: Shona Norleen PEDLAR, MD Primary Care Physician:  Shona Norleen PEDLAR, MD Primary Gastroenterologist:  Dr. Cindie  Chief Complaint  Patient presents with   positive cologuard     Positive cologuard. Time for a colonoscopy. Never had one     HPI:   Anthony Frederick is a 68 y.o. male presenting today at the request of Shona, Norleen PEDLAR, MD for positive cologuard.   Cologuard positive 01/09/24.  Hgb 15.2 on 01/14/24.   Today, patient reports he is doing well. Denies brbpr, melena, unintentional weight loss, change in bowel habits, constipation, diarrhea, abdominal pain.   No prior colonoscopy. No family history of colon cancer.  No other GI concern such as heartburn, dysphagia, nausea, vomiting.   Past Medical History:  Diagnosis Date   Arthritis    Hernia of abdominal wall    History of kidney stones    Hypertension    Left ureteral calculus    Type 2 diabetes mellitus (HCC)     Past Surgical History:  Procedure Laterality Date   ANKLE FRACTURE SURGERY Left    CYSTOSCOPY W/ RETROGRADES Left 04/19/2014   Procedure: CYSTOSCOPY WITH RETROGRADE PYELOGRAM/ INTERPRETATION;  Surgeon: Arlena LILLETTE Gal, MD;  Location: [facility];  Service: Urology;  Laterality: Left;   CYSTOSCOPY WITH STENT PLACEMENT Left 04/19/2014   Procedure: CYSTOSCOPY WITH STENT PLACEMENT;  Surgeon: Arlena LILLETTE Gal, MD;  Location: [facility];  Service: Urology;  Laterality: Left;   CYSTOSCOPY WITH URETEROSCOPY Left 04/19/2014   Procedure: CYSTOSCOPY WITH URETEROSCOPY/ BASKET EXTRACTION;  Surgeon: Arlena LILLETTE Gal, MD;  Location: [facility];  Service: Urology;  Laterality: Left;   RADIAL HEAD ARTHROPLASTY Left 10/27/2019   Procedure: LEFT RADIAL HEAD ARTHROPLASTY;  Surgeon: Josefina Chew, MD;  Location: [facility];  Service: Orthopedics;  Laterality: Left;   removal axillary cyst Left    right knee replacement     UMBILICAL HERNIA REPAIR N/A 08/19/2017    Procedure: HERNIA REPAIR UMBILICAL ADULT WITH MESH;  Surgeon: Kallie Manuelita BROCKS, MD;  Location: [facility];  Service: General;  Laterality: N/A;    Current Outpatient Medications  Medication Sig Dispense Refill   acetaminophen  (TYLENOL ) 500 MG tablet Take 1 tablet (500 mg total) by mouth every 6 (six) hours as needed for mild pain or headache. 30 tablet 0   diltiazem (TIAZAC) 300 MG 24 hr capsule Take 300 mg by mouth daily.     JANUVIA 100 MG tablet Take 100 mg by mouth daily.     losartan (COZAAR) 50 MG tablet Take 50 mg by mouth daily.      metFORMIN (GLUCOPHAGE) 1000 MG tablet Take 1,000 mg by mouth 2 (two) times daily.     No current facility-administered medications for this visit.    Allergies as of 02/03/2024 - Review Complete 02/03/2024  Allergen Reaction Noted   Levaquin [levofloxacin] Rash 10/21/2019    Family History  Problem Relation Age of Onset   Diabetes Mother    Melanoma Father    Colon cancer Neg Hx     Social History   Socioeconomic History   Marital status: Married    Spouse name: Not on file   Number of children: 1   Years of education: 12   Highest education level: Not on file  Occupational History   Not on file  Tobacco Use   Smoking status: Former    Current packs/day: 0.00    Types: Cigarettes    Quit date:  07/09/1958    Years since quitting: 65.6   Smokeless tobacco: Never  Vaping Use   Vaping status: Never Used  Substance and Sexual Activity   Alcohol use: No   Drug use: No   Sexual activity: Yes    Birth control/protection: None  Other Topics Concern   Not on file  Social History Narrative   Not on file   Social Drivers of Health   Financial Resource Strain: Not on file  Food Insecurity: Not on file  Transportation Needs: Not on file  Physical Activity: Not on file  Stress: Not on file  Social Connections: Not on file  Intimate Partner Violence: Not on file    Review of Systems: Gen: Denies any fever, chills, cold or flulike  symptoms, presyncope, syncope. CV: Denies chest pain, heart palpitations. Resp: Denies shortness of breath, cough. GI: See HPI GU : Denies urinary burning, urinary frequency, urinary hesitancy MS: Denies joint pain. Derm: Denies rash. Psych: Denies depression, anxiety. Heme: See HPI  Physical Exam: BP 137/77 (BP Location: Right Arm, Patient Position: Sitting, Cuff Size: Large)   Pulse 62   Temp 97.9 F (36.6 C) (Temporal)   Ht 5' 11 (1.803 m)   Wt 264 lb 9.6 oz (120 kg)   BMI 36.90 kg/m  General:   Alert and oriented. Pleasant and cooperative. Well-nourished and well-developed.  Head:  Normocephalic and atraumatic. Eyes:  Without icterus, sclera clear and conjunctiva pink.  Ears:  Normal auditory acuity. Lungs:  Clear to auscultation bilaterally. No wheezes, rales, or rhonchi. No distress.  Heart:  S1, S2 present without murmurs appreciated.  Abdomen:  +BS, soft, non-tender and non-distended. No HSM noted. No guarding or rebound. No masses appreciated.  Rectal:  Deferred  Msk:  Symmetrical without gross deformities. Normal posture. Extremities:  Without edema. Neurologic:  Alert and  oriented x4;  grossly normal neurologically. Skin:  Intact without significant lesions or rashes. Psych:  Normal mood and affect.    Assessment:  69 year old male with history of HTN, type 2 diabetes, presenting today to discuss scheduling colonoscopy due to positive Cologuard completed 01/09/2024.  He denies any significant GI symptoms.  No alarm symptoms.  Hemoglobin 15.2 on 01/14/2024.  He has never had a colonoscopy.  No family history of colon cancer.   Plan:  Proceed with colonoscopy with propofol  by Dr. Cindie in near future. The risks, benefits, and alternatives have been discussed with the patient in detail. The patient states understanding and desires to proceed.  ASA 2 1 day prior: One half dose of metformin in the morning and evening, Januvia as prescribed Day of: No morning diabetes  medications. Follow-up as needed.   Josette Centers, PA-C [facility] 02/03/2024

## 2024-02-03 ENCOUNTER — Encounter: Payer: Self-pay | Admitting: Gastroenterology

## 2024-02-03 ENCOUNTER — Encounter: Payer: Self-pay | Admitting: *Deleted

## 2024-02-03 ENCOUNTER — Ambulatory Visit (INDEPENDENT_AMBULATORY_CARE_PROVIDER_SITE_OTHER): Payer: No Typology Code available for payment source | Admitting: Gastroenterology

## 2024-02-03 VITALS — BP 137/77 | HR 62 | Temp 97.9°F | Ht 71.0 in | Wt 264.6 lb

## 2024-02-03 DIAGNOSIS — R195 Other fecal abnormalities: Secondary | ICD-10-CM

## 2024-02-03 NOTE — Patient Instructions (Signed)
 Will get you scheduled for a colonoscopy in the near future with Dr. Cindie at [facility]. 1 day prior to your procedure: You will take one half dose of metformin in the morning and evening.  You can take Januvia as prescribed. Day of your procedure: Do not take any morning diabetes medications.  We will see you back in the office as needed.  It was nice to meet you today!  Josette Centers, PA-C [facility]

## 2024-02-04 ENCOUNTER — Other Ambulatory Visit: Payer: Self-pay | Admitting: *Deleted

## 2024-02-04 MED ORDER — PEG 3350-KCL-NA BICARB-NACL 420 G PO SOLR: 4000.0000 mL | Freq: Once | ORAL | 0 refills | Status: AC

## 2024-02-28 ENCOUNTER — Ambulatory Visit (HOSPITAL_BASED_OUTPATIENT_CLINIC_OR_DEPARTMENT_OTHER)
Admission: RE | Admit: 2024-02-28 | Discharge: 2024-02-28 | Disposition: A | Discharge status: Home / Self Care | Source: Home / Self Care | Attending: Internal Medicine | Admitting: Internal Medicine

## 2024-02-28 ENCOUNTER — Encounter (HOSPITAL_COMMUNITY): Payer: Self-pay | Admitting: Internal Medicine

## 2024-02-28 ENCOUNTER — Ambulatory Visit (HOSPITAL_COMMUNITY): Admitting: Certified Registered Nurse Anesthetist

## 2024-02-28 ENCOUNTER — Other Ambulatory Visit: Payer: Self-pay

## 2024-02-28 ENCOUNTER — Encounter (HOSPITAL_COMMUNITY)
Admission: RE | Disposition: A | Discharge status: Home / Self Care | Payer: Self-pay | Source: Home / Self Care | Attending: Internal Medicine

## 2024-02-28 DIAGNOSIS — Z87891 Personal history of nicotine dependence: Secondary | ICD-10-CM | POA: Insufficient documentation

## 2024-02-28 DIAGNOSIS — D123 Benign neoplasm of transverse colon: Secondary | ICD-10-CM

## 2024-02-28 DIAGNOSIS — Z1211 Encounter for screening for malignant neoplasm of colon: Secondary | ICD-10-CM

## 2024-02-28 DIAGNOSIS — K9189 Other postprocedural complications and disorders of digestive system: Secondary | ICD-10-CM | POA: Diagnosis not present

## 2024-02-28 DIAGNOSIS — R195 Other fecal abnormalities: Secondary | ICD-10-CM | POA: Diagnosis not present

## 2024-02-28 DIAGNOSIS — K635 Polyp of colon: Secondary | ICD-10-CM | POA: Diagnosis not present

## 2024-02-28 DIAGNOSIS — K648 Other hemorrhoids: Secondary | ICD-10-CM

## 2024-02-28 DIAGNOSIS — E119 Type 2 diabetes mellitus without complications: Secondary | ICD-10-CM | POA: Insufficient documentation

## 2024-02-28 DIAGNOSIS — I10 Essential (primary) hypertension: Secondary | ICD-10-CM | POA: Insufficient documentation

## 2024-02-28 DIAGNOSIS — K5730 Diverticulosis of large intestine without perforation or abscess without bleeding: Secondary | ICD-10-CM

## 2024-02-28 DIAGNOSIS — D128 Benign neoplasm of rectum: Secondary | ICD-10-CM | POA: Diagnosis not present

## 2024-02-28 DIAGNOSIS — Z7984 Long term (current) use of oral hypoglycemic drugs: Secondary | ICD-10-CM | POA: Diagnosis not present

## 2024-02-28 DIAGNOSIS — K621 Rectal polyp: Secondary | ICD-10-CM

## 2024-02-28 DIAGNOSIS — R000 Tachycardia, unspecified: Secondary | ICD-10-CM | POA: Diagnosis not present

## 2024-02-28 DIAGNOSIS — R7881 Bacteremia: Secondary | ICD-10-CM | POA: Diagnosis not present

## 2024-02-28 DIAGNOSIS — R509 Fever, unspecified: Secondary | ICD-10-CM | POA: Diagnosis not present

## 2024-02-28 HISTORY — PX: COLONOSCOPY: SHX5424

## 2024-02-28 LAB — GLUCOSE, CAPILLARY: Glucose-Capillary: 111 mg/dL — ABNORMAL HIGH (ref 70–99)

## 2024-02-28 SURGERY — COLONOSCOPY
Anesthesia: General

## 2024-02-28 MED ORDER — PROPOFOL 10 MG/ML IV BOLUS
INTRAVENOUS | Status: DC | PRN
  Administered 2024-02-28: 100 mg via INTRAVENOUS
  Administered 2024-02-28: 150 ug/kg/min via INTRAVENOUS
  Administered 2024-02-28: 30 mg via INTRAVENOUS

## 2024-02-28 MED ORDER — AMOXICILLIN-POT CLAVULANATE 875-125 MG PO TABS: 1.0000 | ORAL_TABLET | Freq: Two times a day (BID) | ORAL | 0 refills | Status: DC

## 2024-02-28 MED ORDER — LACTATED RINGERS IV SOLN: INTRAVENOUS | Status: DC | PRN

## 2024-02-28 NOTE — Discharge Instructions (Addendum)
  Colonoscopy Discharge Instructions  Read the instructions outlined below and refer to this sheet in the next few weeks. These discharge instructions provide you with general information on caring for yourself after you leave the hospital. Your doctor may also give you specific instructions. While your treatment has been planned according to the most current medical practices available, unavoidable complications occasionally occur.   ACTIVITY You may resume your regular activity, but move at a slower pace for the next 24 hours.  Take frequent rest periods for the next 24 hours.  Walking will help get rid of the air and reduce the bloated feeling in your belly (abdomen).  No driving for 24 hours (because of the medicine (anesthesia) used during the test).   Do not sign any important legal documents or operate any machinery for 24 hours (because of the anesthesia used during the test).  NUTRITION Drink plenty of fluids.  You may resume your normal diet as instructed by your doctor.  Begin with a light meal and progress to your normal diet. Heavy or fried foods are harder to digest and may make you feel sick to your stomach (nauseated).  Avoid alcoholic beverages for 24 hours or as instructed.  MEDICATIONS You may resume your normal medications unless your doctor tells you otherwise.  WHAT YOU CAN EXPECT TODAY Some feelings of bloating in the abdomen.  Passage of more gas than usual.  Spotting of blood in your stool or on the toilet paper.  IF YOU HAD POLYPS REMOVED DURING THE COLONOSCOPY: No aspirin products for 7 days or as instructed.  No alcohol for 7 days or as instructed.  Eat a soft diet for the next 24 hours.  FINDING OUT THE RESULTS OF YOUR TEST Not all test results are available during your visit. If your test results are not back during the visit, make an appointment with your caregiver to find out the results. Do not assume everything is normal if you have not heard from your  caregiver or the medical facility. It is important for you to follow up on all of your test results.  SEEK IMMEDIATE MEDICAL ATTENTION IF: You have more than a spotting of blood in your stool.  Your belly is swollen (abdominal distention).  You are nauseated or vomiting.  You have a temperature over 101.  You have abdominal pain or discomfort that is severe or gets worse throughout the day.   Your colonoscopy revealed a very large polyp in your rectum.  I performed an in-depth resection of this polyp and was able to successfully remove it completely.  You had 3 other small polyps which I also removed.  Await pathology results, my office will contact you.  Given the size of the large polyp, would recommend repeat colonoscopy in 6 months.  Follow-up in GI office in 3 months.  Message sent to office  I am going to send you in 3 days of Augmentin  twice daily to your pharmacy in an effort to prevent complications from large resection today.  I hope you have a great rest of your week!  Carlin POUR. Cindie, D.O. Gastroenterology and Hepatology [facility]

## 2024-02-28 NOTE — Transfer of Care (Signed)
 Immediate Anesthesia Transfer of Care Note  Patient: Anthony Frederick  Procedure(s) Performed: COLONOSCOPY  Patient Location: Endoscopy Unit  Anesthesia Type:General  Level of Consciousness: awake  Airway & Oxygen Therapy: Patient Spontanous Breathing  Post-op Assessment: Report given to RN and Post -op Vital signs reviewed and stable  Post vital signs: Reviewed and stable  Last Vitals:  Vitals Value Taken Time  BP 108/55 02/28/24 11:13  Temp 36.4 C 02/28/24 11:13  Pulse 51 02/28/24 11:13  Resp 13 02/28/24 11:13  SpO2 97 % 02/28/24 11:13    Last Pain:  Vitals:   02/28/24 1113  TempSrc: Oral  PainSc:       Patients Stated Pain Goal: 10 (02/28/24 0959)  Complications: No notable events documented.

## 2024-02-28 NOTE — Anesthesia Preprocedure Evaluation (Signed)
Anesthesia Evaluation  Patient identified by MRN, date of birth, ID band Patient awake    Reviewed: Allergy & Precautions, H&P , NPO status , Patient's Chart, lab work & pertinent test results, reviewed documented beta blocker date and time   Airway Mallampati: II  TM Distance: >3 FB Neck ROM: full    Dental no notable dental hx.    Pulmonary neg pulmonary ROS, former smoker   Pulmonary exam normal breath sounds clear to auscultation       Cardiovascular Exercise Tolerance: Good hypertension, negative cardio ROS  Rhythm:regular Rate:Normal     Neuro/Psych negative neurological ROS  negative psych ROS   GI/Hepatic negative GI ROS, Neg liver ROS,,,  Endo/Other  negative endocrine ROSdiabetes    Renal/GU negative Renal ROS  negative genitourinary   Musculoskeletal   Abdominal   Peds  Hematology negative hematology ROS (+)   Anesthesia Other Findings   Reproductive/Obstetrics negative OB ROS                             Anesthesia Physical Anesthesia Plan  ASA: 2  Anesthesia Plan: General   Post-op Pain Management:    Induction:   PONV Risk Score and Plan: Propofol infusion  Airway Management Planned:   Additional Equipment:   Intra-op Plan:   Post-operative Plan:   Informed Consent: I have reviewed the patients History and Physical, chart, labs and discussed the procedure including the risks, benefits and alternatives for the proposed anesthesia with the patient or authorized representative who has indicated his/her understanding and acceptance.     Dental Advisory Given  Plan Discussed with: CRNA  Anesthesia Plan Comments:        Anesthesia Quick Evaluation

## 2024-02-28 NOTE — Op Note (Addendum)
 [facility] Patient Name: Anthony Frederick Procedure Date: 02/28/2024 10:08 AM MRN: [id number] Date of Birth: 27-Jul-1955 Attending MD: Carlin POUR. Cindie , [place], [id number] CSN: [id number] Age: 68 Admit Type: Outpatient Procedure:                Colonoscopy Indications:              Screening for colorectal malignant neoplasm,                            Incidental - Positive Cologuard test Providers:                Carlin POUR. Cindie, DO, Leandrew Edelman RN, RN, Dorcas Lenis, Technician Referring MD:             Carlin POUR. Cindie, DO Medicines:                See the Anesthesia note for documentation of the                            administered medications Complications:            No immediate complications. Estimated Blood Loss:     Estimated blood loss was minimal. Procedure:                Pre-Anesthesia Assessment:                           - The anesthesia plan was to use monitored                            anesthesia care (MAC).                           After obtaining informed consent, the colonoscope                            was passed under direct vision. Throughout the                            procedure, the patient's blood pressure, pulse, and                            oxygen saturations were monitored continuously. The                            [id number]-HQ190L ([id number]) Peds Colon was introduced                            through the anus and advanced to the the terminal                            ileum, with identification of the appendiceal                            orifice and IC  valve. The colonoscopy was performed                            without difficulty. The patient tolerated the                            procedure well. The quality of the bowel                            preparation was evaluated using the BBPS [facility]                            Bowel Preparation Scale) with scores of: Right                            Colon = 3,  Transverse Colon = 3 and Left Colon = 3                            (entire mucosa seen well with no residual staining,                            small fragments of stool or opaque liquid). The                            total BBPS score equals 9. Scope In: 10:31:12 AM Scope Out: 11:08:51 AM Scope Withdrawal Time: 0 hours 35 minutes 55 seconds  Total Procedure Duration: 0 hours 37 minutes 39 seconds  Findings:      Non-bleeding internal hemorrhoids were found.      Multiple medium-mouthed and small-mouthed diverticula were found in the       sigmoid colon and descending colon.      Two sessile polyps were found in the transverse colon. The polyps were 4       to 5 mm in size. These polyps were removed with a cold snare. Resection       and retrieval were complete.      A 5 mm polyp was found in the rectum. The polyp was sessile. The polyp       was removed with a cold snare. Resection and retrieval were complete.      A 30-40 mm polyp was found in the rectum. The polyp was carpet-like and       multi-lobulated. Preparations were made for mucosal resection.       Demarcation of the lesion was performed during the procedure, with       high-definition white light and narrow band imaging to clearly identify       the boundaries of the lesion. Eleview was injected to raise the lesion.       Piecemeal snare mucosal resection was performed. Resection and retrieval       were complete. Resected tissue margins were examined and clear of polyp       tissue. Area was successfully injected with 3 mL Purastat for drug       delivery, to prevent post polypectomy bleeding. Impression:               - Non-bleeding internal hemorrhoids.                           -  Diverticulosis in the sigmoid colon and in the                            descending colon.                           - Two 4 to 5 mm polyps in the transverse colon,                            removed with a cold snare. Resected and  retrieved.                           - One 5 mm polyp in the rectum, removed with a cold                            snare. Resected and retrieved.                           - One 30 mm polyp in the rectum, removed with                            mucosal resection. Resected and retrieved. Injected.                           - Mucosal resection was performed. Resection and                            retrieval were complete. Moderate Sedation:      Per Anesthesia Care Recommendation:           - Patient has a contact number available for                            emergencies. The signs and symptoms of potential                            delayed complications were discussed with the                            patient. Return to normal activities tomorrow.                            Written discharge instructions were provided to the                            patient.                           - Resume previous diet.                           - Continue present medications.                           - Await pathology results.                           -  Repeat colonoscopy in 6 months for surveillance.                           - Return to GI clinic in 3 months. Procedure Code(s):        --- Professional ---                           [id number], Colonoscopy, flexible; with endoscopic                            mucosal resection                           [phone], 59, Colonoscopy, flexible; with removal of                            tumor(s), polyp(s), or other lesion(s) by snare                            technique Diagnosis Code(s):        --- Professional ---                           Z12.11, Encounter for screening for malignant                            neoplasm of colon                           K64.8, Other hemorrhoids                           D12.3, Benign neoplasm of transverse colon (hepatic                            flexure or splenic flexure)                           D12.8, Benign  neoplasm of rectum                           K57.30, Diverticulosis of large intestine without                            perforation or abscess without bleeding CPT copyright 2022 American Medical Association. All rights reserved. The codes documented in this report are preliminary and upon coder review may  be revised to meet current compliance requirements. Carlin POUR. Cindie, DO Carlin POUR. Cindie, DO 02/28/2024 11:19:23 AM This report has been signed electronically. Number of Addenda: 0

## 2024-02-28 NOTE — Interval H&P Note (Signed)
 History and Physical Interval Note:  02/28/2024 10:13 AM  Anthony Frederick  has presented today for surgery, with the diagnosis of positive cologuard.  The various methods of treatment have been discussed with the patient and family. After consideration of risks, benefits and other options for treatment, the patient has consented to  Procedure(s) with comments: COLONOSCOPY (N/A) - 11:30 am, asa 2 as a surgical intervention.  The patient's history has been reviewed, patient examined, no change in status, stable for surgery.  I have reviewed the patient's chart and labs.  Questions were answered to the patient's satisfaction.     Anthony Frederick

## 2024-03-01 ENCOUNTER — Encounter (HOSPITAL_COMMUNITY): Payer: Self-pay | Admitting: Emergency Medicine

## 2024-03-01 ENCOUNTER — Emergency Department (HOSPITAL_COMMUNITY)

## 2024-03-01 ENCOUNTER — Inpatient Hospital Stay (HOSPITAL_COMMUNITY)
Admission: EM | Admit: 2024-03-01 | Discharge: 2024-03-04 | DRG: 393 | Disposition: A | Discharge status: Home / Self Care | Attending: Internal Medicine | Admitting: Internal Medicine

## 2024-03-01 ENCOUNTER — Other Ambulatory Visit: Payer: Self-pay

## 2024-03-01 DIAGNOSIS — N179 Acute kidney failure, unspecified: Secondary | ICD-10-CM | POA: Diagnosis not present

## 2024-03-01 DIAGNOSIS — K7200 Acute and subacute hepatic failure without coma: Secondary | ICD-10-CM | POA: Diagnosis present

## 2024-03-01 DIAGNOSIS — Z881 Allergy status to other antibiotic agents status: Secondary | ICD-10-CM

## 2024-03-01 DIAGNOSIS — Y838 Other surgical procedures as the cause of abnormal reaction of the patient, or of later complication, without mention of misadventure at the time of the procedure: Secondary | ICD-10-CM | POA: Diagnosis present

## 2024-03-01 DIAGNOSIS — Z96651 Presence of right artificial knee joint: Secondary | ICD-10-CM | POA: Diagnosis present

## 2024-03-01 DIAGNOSIS — D123 Benign neoplasm of transverse colon: Secondary | ICD-10-CM | POA: Diagnosis present

## 2024-03-01 DIAGNOSIS — Z1152 Encounter for screening for COVID-19: Secondary | ICD-10-CM | POA: Diagnosis not present

## 2024-03-01 DIAGNOSIS — E871 Hypo-osmolality and hyponatremia: Secondary | ICD-10-CM | POA: Diagnosis present

## 2024-03-01 DIAGNOSIS — K760 Fatty (change of) liver, not elsewhere classified: Secondary | ICD-10-CM | POA: Diagnosis not present

## 2024-03-01 DIAGNOSIS — I959 Hypotension, unspecified: Secondary | ICD-10-CM | POA: Diagnosis present

## 2024-03-01 DIAGNOSIS — Z860101 Personal history of adenomatous and serrated colon polyps: Secondary | ICD-10-CM | POA: Diagnosis not present

## 2024-03-01 DIAGNOSIS — Z808 Family history of malignant neoplasm of other organs or systems: Secondary | ICD-10-CM

## 2024-03-01 DIAGNOSIS — E1165 Type 2 diabetes mellitus with hyperglycemia: Secondary | ICD-10-CM | POA: Diagnosis present

## 2024-03-01 DIAGNOSIS — E66812 Obesity, class 2: Secondary | ICD-10-CM | POA: Diagnosis present

## 2024-03-01 DIAGNOSIS — K4020 Bilateral inguinal hernia, without obstruction or gangrene, not specified as recurrent: Secondary | ICD-10-CM | POA: Diagnosis not present

## 2024-03-01 DIAGNOSIS — K648 Other hemorrhoids: Secondary | ICD-10-CM | POA: Diagnosis not present

## 2024-03-01 DIAGNOSIS — K9189 Other postprocedural complications and disorders of digestive system: Principal | ICD-10-CM | POA: Diagnosis present

## 2024-03-01 DIAGNOSIS — I10 Essential (primary) hypertension: Secondary | ICD-10-CM | POA: Diagnosis present

## 2024-03-01 DIAGNOSIS — Z833 Family history of diabetes mellitus: Secondary | ICD-10-CM

## 2024-03-01 DIAGNOSIS — Z87891 Personal history of nicotine dependence: Secondary | ICD-10-CM

## 2024-03-01 DIAGNOSIS — K5730 Diverticulosis of large intestine without perforation or abscess without bleeding: Secondary | ICD-10-CM | POA: Diagnosis present

## 2024-03-01 DIAGNOSIS — Z87442 Personal history of urinary calculi: Secondary | ICD-10-CM

## 2024-03-01 DIAGNOSIS — Z6836 Body mass index (BMI) 36.0-36.9, adult: Secondary | ICD-10-CM

## 2024-03-01 DIAGNOSIS — R911 Solitary pulmonary nodule: Secondary | ICD-10-CM | POA: Diagnosis present

## 2024-03-01 DIAGNOSIS — R21 Rash and other nonspecific skin eruption: Secondary | ICD-10-CM | POA: Diagnosis present

## 2024-03-01 DIAGNOSIS — A419 Sepsis, unspecified organism: Secondary | ICD-10-CM | POA: Insufficient documentation

## 2024-03-01 DIAGNOSIS — N200 Calculus of kidney: Secondary | ICD-10-CM | POA: Diagnosis not present

## 2024-03-01 DIAGNOSIS — R509 Fever, unspecified: Secondary | ICD-10-CM | POA: Diagnosis not present

## 2024-03-01 DIAGNOSIS — D128 Benign neoplasm of rectum: Secondary | ICD-10-CM | POA: Diagnosis present

## 2024-03-01 DIAGNOSIS — Z79899 Other long term (current) drug therapy: Secondary | ICD-10-CM

## 2024-03-01 DIAGNOSIS — R7401 Elevation of levels of liver transaminase levels: Secondary | ICD-10-CM | POA: Diagnosis not present

## 2024-03-01 DIAGNOSIS — R6510 Systemic inflammatory response syndrome (SIRS) of non-infectious origin without acute organ dysfunction: Secondary | ICD-10-CM | POA: Diagnosis not present

## 2024-03-01 DIAGNOSIS — E11649 Type 2 diabetes mellitus with hypoglycemia without coma: Secondary | ICD-10-CM | POA: Diagnosis present

## 2024-03-01 DIAGNOSIS — M1990 Unspecified osteoarthritis, unspecified site: Secondary | ICD-10-CM | POA: Diagnosis present

## 2024-03-01 DIAGNOSIS — E876 Hypokalemia: Secondary | ICD-10-CM | POA: Diagnosis not present

## 2024-03-01 DIAGNOSIS — R000 Tachycardia, unspecified: Secondary | ICD-10-CM | POA: Diagnosis not present

## 2024-03-01 DIAGNOSIS — K429 Umbilical hernia without obstruction or gangrene: Secondary | ICD-10-CM | POA: Diagnosis not present

## 2024-03-01 DIAGNOSIS — R7881 Bacteremia: Secondary | ICD-10-CM | POA: Diagnosis present

## 2024-03-01 DIAGNOSIS — E860 Dehydration: Secondary | ICD-10-CM | POA: Diagnosis present

## 2024-03-01 DIAGNOSIS — Z7984 Long term (current) use of oral hypoglycemic drugs: Secondary | ICD-10-CM | POA: Diagnosis not present

## 2024-03-01 DIAGNOSIS — R7989 Other specified abnormal findings of blood chemistry: Secondary | ICD-10-CM | POA: Diagnosis not present

## 2024-03-01 LAB — CBC WITH DIFFERENTIAL/PLATELET
Abs Immature Granulocytes: 0.08 K/uL — ABNORMAL HIGH (ref 0.00–0.07)
Basophils Absolute: 0 K/uL (ref 0.0–0.1)
Basophils Relative: 0 %
Eosinophils Absolute: 0.5 K/uL (ref 0.0–0.5)
Eosinophils Relative: 3 %
HCT: 40.3 % (ref 39.0–52.0)
Hemoglobin: 13.6 g/dL (ref 13.0–17.0)
Immature Granulocytes: 1 %
Lymphocytes Relative: 4 %
Lymphs Abs: 0.6 K/uL — ABNORMAL LOW (ref 0.7–4.0)
MCH: 29.4 pg (ref 26.0–34.0)
MCHC: 33.7 g/dL (ref 30.0–36.0)
MCV: 87 fL (ref 80.0–100.0)
Monocytes Absolute: 0.6 K/uL (ref 0.1–1.0)
Monocytes Relative: 4 %
Neutro Abs: 12.9 K/uL — ABNORMAL HIGH (ref 1.7–7.7)
Neutrophils Relative %: 88 %
Platelets: 277 K/uL (ref 150–400)
RBC: 4.63 MIL/uL (ref 4.22–5.81)
RDW: 12.9 % (ref 11.5–15.5)
WBC: 14.6 K/uL — ABNORMAL HIGH (ref 4.0–10.5)
nRBC: 0 % (ref 0.0–0.2)

## 2024-03-01 LAB — RESP PANEL BY RT-PCR (RSV, FLU A&B, COVID)  RVPGX2
Influenza A by PCR: NEGATIVE
Influenza B by PCR: NEGATIVE
Resp Syncytial Virus by PCR: NEGATIVE
SARS Coronavirus 2 by RT PCR: NEGATIVE

## 2024-03-01 LAB — URINALYSIS, ROUTINE W REFLEX MICROSCOPIC
Bacteria, UA: NONE SEEN
Bilirubin Urine: NEGATIVE
Glucose, UA: NEGATIVE mg/dL
Hgb urine dipstick: NEGATIVE
Ketones, ur: NEGATIVE mg/dL
Leukocytes,Ua: NEGATIVE
Nitrite: NEGATIVE
Protein, ur: 30 mg/dL — AB
Specific Gravity, Urine: 1.045 — ABNORMAL HIGH (ref 1.005–1.030)
pH: 5 (ref 5.0–8.0)

## 2024-03-01 LAB — LACTIC ACID, PLASMA
Lactic Acid, Venous: 1.4 mmol/L (ref 0.5–1.9)
Lactic Acid, Venous: 1.3 mmol/L (ref 0.5–1.9)

## 2024-03-01 LAB — COMPREHENSIVE METABOLIC PANEL WITH GFR
ALT: 87 U/L — ABNORMAL HIGH (ref 0–44)
AST: 73 U/L — ABNORMAL HIGH (ref 15–41)
Albumin: 3.5 g/dL (ref 3.5–5.0)
Alkaline Phosphatase: 55 U/L (ref 38–126)
Anion gap: 13 (ref 5–15)
BUN: 36 mg/dL — ABNORMAL HIGH (ref 8–23)
CO2: 20 mmol/L — ABNORMAL LOW (ref 22–32)
Calcium: 8.9 mg/dL (ref 8.9–10.3)
Chloride: 98 mmol/L (ref 98–111)
Creatinine, Ser: 1.66 mg/dL — ABNORMAL HIGH (ref 0.61–1.24)
GFR, Estimated: 45 mL/min — ABNORMAL LOW (ref >60)
Glucose, Bld: 160 mg/dL — ABNORMAL HIGH (ref 70–99)
Potassium: 3.2 mmol/L — ABNORMAL LOW (ref 3.5–5.1)
Sodium: 131 mmol/L — ABNORMAL LOW (ref 135–145)
Total Bilirubin: 1.1 mg/dL (ref 0.0–1.2)
Total Protein: 6.8 g/dL (ref 6.5–8.1)

## 2024-03-01 MED ORDER — PIPERACILLIN-TAZOBACTAM 3.375 G IVPB
3.3750 g | Freq: Three times a day (TID) | INTRAVENOUS | Status: DC
  Administered 2024-03-02 – 2024-03-03 (×6): 3.375 g via INTRAVENOUS
  Filled 2024-03-01 (×6): qty 50

## 2024-03-01 MED ORDER — ONDANSETRON HCL 4 MG/2ML IJ SOLN: 4.0000 mg | Freq: Four times a day (QID) | INTRAMUSCULAR | Status: DC | PRN

## 2024-03-01 MED ORDER — DILTIAZEM HCL ER COATED BEADS 180 MG PO CP24
360.0000 mg | ORAL_CAPSULE | Freq: Every day | ORAL | Status: DC
  Administered 2024-03-02: 360 mg via ORAL
  Filled 2024-03-01: qty 2

## 2024-03-01 MED ORDER — LOSARTAN POTASSIUM 50 MG PO TABS
100.0000 mg | ORAL_TABLET | Freq: Every day | ORAL | Status: DC
  Administered 2024-03-02: 100 mg via ORAL
  Filled 2024-03-01: qty 2

## 2024-03-01 MED ORDER — ONDANSETRON HCL 4 MG PO TABS: 4.0000 mg | ORAL_TABLET | Freq: Four times a day (QID) | ORAL | Status: DC | PRN

## 2024-03-01 MED ORDER — ENOXAPARIN SODIUM 40 MG/0.4ML IJ SOSY
40.0000 mg | PREFILLED_SYRINGE | INTRAMUSCULAR | Status: DC
  Administered 2024-03-02 – 2024-03-04 (×3): 40 mg via SUBCUTANEOUS
  Filled 2024-03-01 (×3): qty 0.4

## 2024-03-01 MED ORDER — ACETAMINOPHEN 500 MG PO TABS
1000.0000 mg | ORAL_TABLET | Freq: Once | ORAL | Status: AC
  Administered 2024-03-01: 1000 mg via ORAL
  Filled 2024-03-01: qty 2

## 2024-03-01 MED ORDER — LACTATED RINGERS IV BOLUS
1000.0000 mL | Freq: Once | INTRAVENOUS | Status: AC
  Administered 2024-03-01: 1000 mL via INTRAVENOUS

## 2024-03-01 MED ORDER — IOHEXOL 300 MG/ML  SOLN
80.0000 mL | Freq: Once | INTRAMUSCULAR | Status: AC | PRN
  Administered 2024-03-01: 80 mL via INTRAVENOUS

## 2024-03-01 MED ORDER — SODIUM CHLORIDE 0.9 % IV SOLN
2.0000 g | Freq: Once | INTRAVENOUS | Status: AC
  Administered 2024-03-01: 2 g via INTRAVENOUS
  Filled 2024-03-01: qty 12.5

## 2024-03-01 MED ORDER — POTASSIUM CHLORIDE CRYS ER 20 MEQ PO TBCR
40.0000 meq | EXTENDED_RELEASE_TABLET | Freq: Once | ORAL | Status: AC
  Administered 2024-03-02: 40 meq via ORAL
  Filled 2024-03-01: qty 2

## 2024-03-01 NOTE — H&P (Signed)
 History and Physical    Patient: Anthony Frederick FMW:[id number] DOB: 1956/02/26 DOA: 03/01/2024 DOS: the patient was seen and examined on 03/01/2024 PCP: Shona Norleen PEDLAR, MD  Patient coming from: Home  Chief Complaint:  Chief Complaint  Patient presents with   Fever   HPI: Anthony Frederick is a 68 y.o. male with medical history significant of hypertension, type 2 diabetes mellitus, arthritis who presents to the emergency department due to 1 day onset of fever and chills.  Patient had a colonoscopy done on 02/28/2024 with removal of a large polyp, he complained of fever and chills that started about 24 hours after the procedure and this was associated with soft BPs of 80s/60s.  He denied nausea, vomiting, chest pain, abdominal pain, shortness of breath.  ED Course:  In the emergency department, patient was febrile with a temperature of 101.53F,, respiratory rate 23/min, pulse 101 bpm, BP 122/82, O2 sat 91%.  Workup in the ED showed normal CBC except for WBC of 14.6, BMP shows sodium 131, potassium 3.2, chloride 98, bicarb 20, blood glucose 160, BUN 36, creatinine 1.66, AST 73, ALT 87, lactic acid x 2 was normal, urinalysis was normal.  Influenza A, B, SARS-CoV-2, RSV was negative.  Blood culture pending. CT abdomen and pelvis with contrast showed punctate nonobstructing right renal stone and right part solid pulmonary nodule measuring 7 mm. Gastroenterologist (Dr. Eartha) was consulted and states that risk for bacterial translocation is high in patient due to the size of the polypectomy and since patient failed oral antibiotic (Augmentin ), he should be on IV antibiotics until culture results. He was started on IV cefepime .  Review of Systems: Review of systems as noted in the HPI. All other systems reviewed and are negative.   Past Medical History:  Diagnosis Date   Arthritis    Hernia of abdominal wall    History of kidney stones    Hypertension    Left ureteral calculus    Type 2  diabetes mellitus (HCC)    Past Surgical History:  Procedure Laterality Date   ANKLE FRACTURE SURGERY Left    COLONOSCOPY N/A 02/28/2024   Procedure: COLONOSCOPY;  Surgeon: Cindie Carlin POUR, DO;  Location: [facility];  Service: Endoscopy;  Laterality: N/A;  11:30 am, asa 2   CYSTOSCOPY W/ RETROGRADES Left 04/19/2014   Procedure: CYSTOSCOPY WITH RETROGRADE PYELOGRAM/ INTERPRETATION;  Surgeon: Arlena LILLETTE Gal, MD;  Location: [facility];  Service: Urology;  Laterality: Left;   CYSTOSCOPY WITH STENT PLACEMENT Left 04/19/2014   Procedure: CYSTOSCOPY WITH STENT PLACEMENT;  Surgeon: Arlena LILLETTE Gal, MD;  Location: [facility];  Service: Urology;  Laterality: Left;   CYSTOSCOPY WITH URETEROSCOPY Left 04/19/2014   Procedure: CYSTOSCOPY WITH URETEROSCOPY/ BASKET EXTRACTION;  Surgeon: Arlena LILLETTE Gal, MD;  Location: [facility];  Service: Urology;  Laterality: Left;   RADIAL HEAD ARTHROPLASTY Left 10/27/2019   Procedure: LEFT RADIAL HEAD ARTHROPLASTY;  Surgeon: Josefina Chew, MD;  Location: [facility];  Service: Orthopedics;  Laterality: Left;   removal axillary cyst Left    right knee replacement     UMBILICAL HERNIA REPAIR N/A 08/19/2017   Procedure: HERNIA REPAIR UMBILICAL ADULT WITH MESH;  Surgeon: Kallie Manuelita BROCKS, MD;  Location: [facility];  Service: General;  Laterality: N/A;    Social History:  reports that he quit smoking about 65 years ago. His smoking use included cigarettes. He has never used smokeless tobacco. He reports that he does not drink alcohol  and does not use drugs.   Allergies  Allergen Reactions   Levaquin [Levofloxacin] Rash    Family History  Problem Relation Age of Onset   Diabetes Mother    Melanoma Father    Colon cancer Neg Hx      Prior to Admission medications   Medication Sig Start Date End Date Taking? Authorizing Provider  acetaminophen  (TYLENOL ) 500 MG tablet Take 1 tablet (500 mg total) by mouth  every 6 (six) hours as needed for mild pain or headache. 10/27/19  Yes Brown, Blaine K, PA-C  amoxicillin -clavulanate (AUGMENTIN ) 875-125 MG tablet Take 1 tablet by mouth 2 (two) times daily for 3 days. 02/28/24 03/02/24 Yes Carver, Carlin POUR, DO  diltiazem  (CARDIZEM  CD) 360 MG 24 hr capsule Take 360 mg by mouth at bedtime. 01/25/24  Yes [provider]  JANUVIA 100 MG tablet Take 100 mg by mouth daily.   Yes [provider]  losartan -hydrochlorothiazide (HYZAAR) 100-25 MG tablet Take 1 tablet by mouth daily.   Yes [provider]  metFORMIN (GLUCOPHAGE) 1000 MG tablet Take 1,000 mg by mouth 2 (two) times daily.   Yes [provider]    Physical Exam: BP (!) 152/68 (BP Location: Right Arm)   Pulse (!) 105   Temp 98.2 F (36.8 C) (Oral)   Resp 19   Ht 5' 11 (1.803 m)   Wt 118.6 kg   SpO2 99%   BMI 36.47 kg/m   General: 68 y.o. year-old male well developed well nourished in no acute distress.  Alert and oriented x3.  Noted with chills. HEENT: NCAT, EOMI, dry mucous membrane Neck: Supple, trachea medial Cardiovascular: Regular rate and rhythm with no rubs or gallops.  No thyromegaly or JVD noted.  No lower extremity edema. 2/4 pulses in all 4 extremities. Respiratory: Clear to auscultation with no wheezes or rales. Good inspiratory effort. Abdomen: Soft, nontender nondistended with normal bowel sounds x4 quadrants. Muskuloskeletal: No cyanosis, clubbing or edema noted bilaterally Neuro: CN II-XII intact, strength 5/5 x 4, sensation, reflexes intact Skin: Notable rash in body (reported to have started after IV antibiotics).  No ulcerative lesions noted. Psychiatry: Judgement and insight appear normal. Mood is appropriate for condition and setting          Labs on Admission:  Basic Metabolic Panel: Recent Labs  Lab 03/01/24 1746  NA 131*  K 3.2*  CL 98  CO2 20*  GLUCOSE 160*  BUN 36*  CREATININE 1.66*  CALCIUM 8.9   Liver Function  Tests: Recent Labs  Lab 03/01/24 1746  AST 73*  ALT 87*  ALKPHOS 55  BILITOT 1.1  PROT 6.8  ALBUMIN 3.5   No results for input(s): LIPASE, AMYLASE in the last 168 hours. No results for input(s): AMMONIA in the last 168 hours. CBC: Recent Labs  Lab 03/01/24 1746  WBC 14.6*  NEUTROABS 12.9*  HGB 13.6  HCT 40.3  MCV 87.0  PLT 277   Cardiac Enzymes: No results for input(s): CKTOTAL, CKMB, CKMBINDEX, TROPONINI in the last 168 hours.  BNP (last 3 results) No results for input(s): BNP in the last 8760 hours.  ProBNP (last 3 results) No results for input(s): PROBNP in the last 8760 hours.  CBG: Recent Labs  Lab 02/28/24 1008  GLUCAP 111*    Radiological Exams on Admission: CT ABDOMEN PELVIS W CONTRAST Result Date: 03/01/2024 CLINICAL DATA:  Left lower quadrant pain, history of recent colonoscopy with polyp removal EXAM: CT ABDOMEN AND PELVIS WITH CONTRAST TECHNIQUE:  Multidetector CT imaging of the abdomen and pelvis was performed using the standard protocol following bolus administration of intravenous contrast. RADIATION DOSE REDUCTION: This exam was performed according to the departmental dose-optimization program which includes automated exposure control, adjustment of the mA and/or kV according to patient size and/or use of iterative reconstruction technique. CONTRAST:  80mL OMNIPAQUE  IOHEXOL  300 MG/ML  SOLN COMPARISON:  08/05/2017 FINDINGS: Lower chest: 7 mm sub solid nodule is noted posteriorly in the right lower lobe. No other nodules are seen. No effusion is noted. Hepatobiliary: Fatty infiltration of the liver is noted. The gallbladder is within normal limits. Pancreas: Unremarkable. No pancreatic ductal dilatation or surrounding inflammatory changes. Spleen: Normal in size without focal abnormality. Adrenals/Urinary Tract: Adrenal glands are within normal limits. Kidneys demonstrate a normal enhancement pattern bilaterally. Punctate nonobstructing right  renal stone is seen. No obstructive changes are seen. The bladder is within normal limits. Stomach/Bowel: Mild diverticular changes noted in the colon. The appendix is within normal limits. No small bowel or gastric abnormality is noted. Vascular/Lymphatic: Aortic atherosclerosis. No enlarged abdominal or pelvic lymph nodes. Reproductive: Prostate is unremarkable. Other: Bilateral fat containing inguinal hernias are noted stable from the prior exam. Previously seen umbilical hernia has been repaired. No free fluid is noted. Musculoskeletal: There are changes of lumbar spine are noted. No acute bony abnormality is seen. IMPRESSION: Punctate nonobstructing right renal stone. Diverticulosis without diverticulitis. Bilateral fat containing inguinal hernias. Right part-solid pulmonary nodule measuring 7 mm. Per Fleischner Society Guidelines, recommend a non-contrast Chest CT at 3-6 months to confirm persistence. If unchanged and the solid component remains < 6 mm, an annual non-contrast Chest CT should be performed for 5 years. If the solid component is 6-8 mm on follow-up, recommend biopsy or resection. If the solid component is > 8 mm on follow-up, recommend PET/CT, biopsy or resection. These guidelines do not apply to immunocompromised patients and patients with cancer. Follow up in patients with significant comorbidities as clinically warranted. For lung cancer screening, adhere to Lung-RADS guidelines. Reference: Radiology. 2017; 284(1):228-43. Electronically Signed   By: Oneil Devonshire M.D.   On: 03/01/2024 20:04    EKG: I independently viewed the EKG done and my findings are as followed: Sinus tachycardia at rate of 105 bpm with multiple PVCs  Assessment/Plan Present on Admission:  Bacteremia  Principal Problem:   Bacteremia Active Problems:   Sepsis (HCC)   Hyponatremia   Hypokalemia   AKI (acute kidney injury) (HCC)   Dehydration   Transaminitis   Pulmonary nodule   Essential hypertension    Type 2 diabetes mellitus with hyperglycemia (HCC)   Obesity, Class II, BMI 35-39.9  Sepsis secondary to bacteremia s/p polypectomy Patient met sepsis criteria due to being febrile with a temperature of 101.32F, tachypneic, tachycardic and having leukocytosis with WBC of 14.6 (met SIRS criteria).  Polypectomy site considered as source of bacteremia Patient was started on IV cefepime  (but developed what appeared to be a drug rash) Antibiotics will be changed to Zosyn , since patient had no reaction to Augmentin  taken as an outpatient Blood culture pending Gastroenterologist (Dr. Eartha) was consulted and agreed with IV antibiotics.  Patient will be seen in the morning per EDP.  Hyponatremia Na 131.  This is possibly due to dehydration considering the patient is on diuretics Continue gentle hydration Orthostatic BP will be checked to rule out postural hypotension Continue gentle hydration Continue to monitor sodium with serial BMPs Urine osmolality, serum osmolality and urine sodium will be checked  Hypokalemia K+ 3.2, this will be replenished  Acute kidney injury Dehydration Creatinine at 1.66 (most recent creatinine on chart was 4 years ago and it was 1.0) Continue gentle hydration Renally adjust medications, avoid nephrotoxic agents/dehydration/hypotension  Transaminitis AST 73, ALT 87 Patient denies abdominal pain Continue to monitor liver enzymes  Pulmonary nodule 7 mm right part solid pulmonary nodule was noted on CT abdomen and pelvis with contrast A non-contrast Chest CT at 3-6 months to confirm persistence. If unchanged and the solid component remains < 6 mm, an annual non-contrast Chest CT should be performed for 5 years. If the solid component is 6-8 mm on follow-up, recommend biopsy or resection. If the solid component is > 8 mm on follow-up, recommend PET/CT, biopsy or resection. These guidelines do not apply to immunocompromised patients and patients with  cancer.  Essential hypertension Continue diltiazem , losartan  HCTZ temporarily held due to acute kidney injury and dehydration  Type 2 diabetes mellitus with hyperglycemia Continue ISS and hypoglycemic protocol  Obesity class II (BMI 36.47) Diet and lifestyle modification  DVT prophylaxis: Lovenox   Code Status: Full code  Family Communication: Wife at bedside (all questions answered to satisfaction)  Consults: Gastroenterology  Severity of Illness: The appropriate patient status for this patient is INPATIENT. Inpatient status is judged to be reasonable and necessary in order to provide the required intensity of service to ensure the patient's safety. The patient's presenting symptoms, physical exam findings, and initial radiographic and laboratory data in the context of their chronic comorbidities is felt to place them at high risk for further clinical deterioration. Furthermore, it is not anticipated that the patient will be medically stable for discharge from the hospital within 2 midnights of admission.   * I certify that at the point of admission it is my clinical judgment that the patient will require inpatient hospital care spanning beyond 2 midnights from the point of admission due to high intensity of service, high risk for further deterioration and high frequency of surveillance required.*  Author: Keenya Matera, DO 03/01/2024 11:46 PM  For on call review [email or web address].

## 2024-03-01 NOTE — Plan of Care (Signed)

## 2024-03-01 NOTE — ED Triage Notes (Signed)
 Pt c/o fever, chills, weakness since colonoscopy Friday.

## 2024-03-01 NOTE — ED Notes (Signed)
 Report given to [employer] of 300 at this time. No questions, comments, or concerns after report was given.

## 2024-03-01 NOTE — ED Provider Notes (Signed)
 [place] EMERGENCY DEPARTMENT AT [facility] Provider Note  CSN: [id number] Arrival date & time: 03/01/24 1735  Chief Complaint(s) Fever  HPI Anthony Frederick is a 68 y.o. male with PMH T2DM, arthritis, HTN, recent colonoscopy on 02/28/2024 with large polyp removal who presents emerged part for evaluation of a fever.  States that symptoms began approximate 24 hours after his colonoscopy and he is feeling chills with documented fevers at home but no associated nausea, vomiting, abdominal pain, headache, chest pain, shortness of breath or other systemic symptoms.   Past Medical History Past Medical History:  Diagnosis Date   Arthritis    Hernia of abdominal wall    History of kidney stones    Hypertension    Left ureteral calculus    Type 2 diabetes mellitus [facility])    Patient Active Problem List   Diagnosis Date Noted   Bacteremia 03/01/2024   Fracture of radial head, left, closed 10/27/2019   Incarcerated epigastric hernia    Umbilical hernia without obstruction and without gangrene    Home Medication(s) Prior to Admission medications   Medication Sig Start Date End Date Taking? Authorizing Provider  acetaminophen  (TYLENOL ) 500 MG tablet Take 1 tablet (500 mg total) by mouth every 6 (six) hours as needed for mild pain or headache. 10/27/19  Yes Brown, Blaine K, PA-C  amoxicillin -clavulanate (AUGMENTIN ) 875-125 MG tablet Take 1 tablet by mouth 2 (two) times daily for 3 days. 02/28/24 03/02/24 Yes Carver, Charles K, DO  diltiazem  (CARDIZEM  CD) 360 MG 24 hr capsule Take 360 mg by mouth at bedtime. 01/25/24  Yes [provider]  JANUVIA 100 MG tablet Take 100 mg by mouth daily.   Yes [provider]  losartan -hydrochlorothiazide (HYZAAR) 100-25 MG tablet Take 1 tablet by mouth daily.   Yes [provider]  metFORMIN (GLUCOPHAGE) 1000 MG tablet Take 1,000 mg by mouth 2 (two) times daily.   Yes [provider]                                                                                                                                     Past Surgical History Past Surgical History:  Procedure Laterality Date   ANKLE FRACTURE SURGERY Left    COLONOSCOPY N/A 02/28/2024   Procedure: COLONOSCOPY;  Surgeon: Cindie Carlin POUR, DO;  Location: [facility];  Service: Endoscopy;  Laterality: N/A;  11:30 am, asa 2   CYSTOSCOPY W/ RETROGRADES Left 04/19/2014   Procedure: CYSTOSCOPY WITH RETROGRADE PYELOGRAM/ INTERPRETATION;  Surgeon: Arlena LILLETTE Gal, MD;  Location: [facility];  Service: Urology;  Laterality: Left;   CYSTOSCOPY WITH STENT PLACEMENT Left 04/19/2014   Procedure: CYSTOSCOPY WITH STENT PLACEMENT;  Surgeon: Arlena LILLETTE Gal, MD;  Location: [facility];  Service: Urology;  Laterality: Left;   CYSTOSCOPY WITH URETEROSCOPY Left 04/19/2014   Procedure: CYSTOSCOPY WITH URETEROSCOPY/ BASKET EXTRACTION;  Surgeon: Arlena LILLETTE Gal,  MD;  Location: [facility];  Service: Urology;  Laterality: Left;   RADIAL HEAD ARTHROPLASTY Left 10/27/2019   Procedure: LEFT RADIAL HEAD ARTHROPLASTY;  Surgeon: Josefina Chew, MD;  Location: [facility];  Service: Orthopedics;  Laterality: Left;   removal axillary cyst Left    right knee replacement     UMBILICAL HERNIA REPAIR N/A 08/19/2017   Procedure: HERNIA REPAIR UMBILICAL ADULT WITH MESH;  Surgeon: Kallie Manuelita BROCKS, MD;  Location: [facility];  Service: General;  Laterality: N/A;   Family History Family History  Problem Relation Age of Onset   Diabetes Mother    Melanoma Father    Colon cancer Neg Hx     Social History Social History   Tobacco Use   Smoking status: Former    Current packs/day: 0.00    Types: Cigarettes    Quit date: 07/09/1958    Years since quitting: 65.6   Smokeless tobacco: Never  Vaping Use   Vaping status: Never Used  Substance Use Topics   Alcohol use: No   Drug use: No   Allergies Levaquin  [levofloxacin]  Review of Systems Review of Systems  Constitutional:  Positive for chills, fatigue and fever.    Physical Exam Vital Signs  I have reviewed the triage vital signs BP 120/69   Pulse 84   Temp 99 F (37.2 C) (Oral)   Resp 20   Ht 5' 11 (1.803 m)   Wt 116.1 kg   SpO2 94%   BMI 35.70 kg/m   Physical Exam Constitutional:      General: He is not in acute distress.    Appearance: Normal appearance.  HENT:     Head: Normocephalic and atraumatic.     Nose: No congestion or rhinorrhea.  Eyes:     General:        Right eye: No discharge.        Left eye: No discharge.     Extraocular Movements: Extraocular movements intact.     Pupils: Pupils are equal, round, and reactive to light.  Cardiovascular:     Rate and Rhythm: Normal rate and regular rhythm.     Heart sounds: No murmur heard. Pulmonary:     Effort: No respiratory distress.     Breath sounds: No wheezing or rales.  Abdominal:     General: There is no distension.     Tenderness: There is no abdominal tenderness.  Musculoskeletal:        General: Normal range of motion.     Cervical back: Normal range of motion.  Skin:    General: Skin is warm and dry.  Neurological:     General: No focal deficit present.     Mental Status: He is alert.     ED Results and Treatments Labs (all labs ordered are listed, but only abnormal results are displayed) Labs Reviewed  COMPREHENSIVE METABOLIC PANEL WITH GFR - Abnormal; Notable for the following components:      Result Value   Sodium 131 (*)    Potassium 3.2 (*)    CO2 20 (*)    Glucose, Bld 160 (*)    BUN 36 (*)    Creatinine, Ser 1.66 (*)    AST 73 (*)    ALT 87 (*)    GFR, Estimated 45 (*)    All other components within normal limits  CBC WITH DIFFERENTIAL/PLATELET - Abnormal; Notable for the following components:   WBC 14.6 (*)    Neutro Abs  12.9 (*)    Lymphs Abs 0.6 (*)    Abs Immature Granulocytes 0.08 (*)    All other components within  normal limits  URINALYSIS, ROUTINE W REFLEX MICROSCOPIC - Abnormal; Notable for the following components:   Specific Gravity, Urine 1.045 (*)    Protein, ur 30 (*)    All other components within normal limits  RESP PANEL BY RT-PCR (RSV, FLU A&B, COVID)  RVPGX2  CULTURE, BLOOD (ROUTINE X 2)  CULTURE, BLOOD (ROUTINE X 2)  LACTIC ACID, PLASMA  LACTIC ACID, PLASMA                                                                                                                          Radiology CT ABDOMEN PELVIS W CONTRAST Result Date: 03/01/2024 CLINICAL DATA:  Left lower quadrant pain, history of recent colonoscopy with polyp removal EXAM: CT ABDOMEN AND PELVIS WITH CONTRAST TECHNIQUE: Multidetector CT imaging of the abdomen and pelvis was performed using the standard protocol following bolus administration of intravenous contrast. RADIATION DOSE REDUCTION: This exam was performed according to the departmental dose-optimization program which includes automated exposure control, adjustment of the mA and/or kV according to patient size and/or use of iterative reconstruction technique. CONTRAST:  80mL OMNIPAQUE  IOHEXOL  300 MG/ML  SOLN COMPARISON:  08/05/2017 FINDINGS: Lower chest: 7 mm sub solid nodule is noted posteriorly in the right lower lobe. No other nodules are seen. No effusion is noted. Hepatobiliary: Fatty infiltration of the liver is noted. The gallbladder is within normal limits. Pancreas: Unremarkable. No pancreatic ductal dilatation or surrounding inflammatory changes. Spleen: Normal in size without focal abnormality. Adrenals/Urinary Tract: Adrenal glands are within normal limits. Kidneys demonstrate a normal enhancement pattern bilaterally. Punctate nonobstructing right renal stone is seen. No obstructive changes are seen. The bladder is within normal limits. Stomach/Bowel: Mild diverticular changes noted in the colon. The appendix is within normal limits. No small bowel or gastric abnormality  is noted. Vascular/Lymphatic: Aortic atherosclerosis. No enlarged abdominal or pelvic lymph nodes. Reproductive: Prostate is unremarkable. Other: Bilateral fat containing inguinal hernias are noted stable from the prior exam. Previously seen umbilical hernia has been repaired. No free fluid is noted. Musculoskeletal: There are changes of lumbar spine are noted. No acute bony abnormality is seen. IMPRESSION: Punctate nonobstructing right renal stone. Diverticulosis without diverticulitis. Bilateral fat containing inguinal hernias. Right part-solid pulmonary nodule measuring 7 mm. Per Fleischner Society Guidelines, recommend a non-contrast Chest CT at 3-6 months to confirm persistence. If unchanged and the solid component remains < 6 mm, an annual non-contrast Chest CT should be performed for 5 years. If the solid component is 6-8 mm on follow-up, recommend biopsy or resection. If the solid component is > 8 mm on follow-up, recommend PET/CT, biopsy or resection. These guidelines do not apply to immunocompromised patients and patients with cancer. Follow up in patients with significant comorbidities as clinically warranted. For lung cancer screening, adhere to Lung-RADS guidelines. Reference: Radiology. 2017; 284(1):228-43. Electronically Signed  By: Oneil Devonshire M.D.   On: 03/01/2024 20:04    Pertinent labs & imaging results that were available during my care of the patient were reviewed by me and considered in my medical decision making (see MDM for details).  Medications Ordered in ED Medications  acetaminophen  (TYLENOL ) tablet 1,000 mg (has no administration in time range)  ceFEPIme  (MAXIPIME ) 2 g in sodium chloride  0.9 % 100 mL IVPB (0 g Intravenous Stopped 03/01/24 2100)  iohexol  (OMNIPAQUE ) 300 MG/ML solution 80 mL (80 mLs Intravenous Contrast Given 03/01/24 1943)  lactated ringers  bolus 1,000 mL (1,000 mLs Intravenous New Bag/Given 03/01/24 2036)                                                                                                                                      Procedures .Critical Care  Performed by: Albertina Dixon, MD Authorized by: Albertina Dixon, MD   Critical care provider statement:    Critical care time (minutes):  30   Critical care was necessary to treat or prevent imminent or life-threatening deterioration of the following conditions:  Sepsis   Critical care was time spent personally by me on the following activities:  Development of treatment plan with patient or surrogate, discussions with consultants, evaluation of patient's response to treatment, examination of patient, ordering and review of laboratory studies, ordering and review of radiographic studies, ordering and performing treatments and interventions, pulse oximetry, re-evaluation of patient's condition and review of old charts   (including critical care time)  Medical Decision Making / ED Course   This patient presents to the ED for concern of fever, this involves an extensive number of treatment options, and is a complaint that carries with it a high risk of complications and morbidity.  The differential diagnosis includes bacteremia, abscess, surgical complication, viral illness  MDM: Patient seen emerged part for evaluation of a fever.  Physical exam with a mild tachycardia, mild tenderness in the lower abdomen but is otherwise unremarkable.  Laboratory evaluation with a leukocytosis to 14.6, sodium 131, potassium 3.2, BUN 36, creatinine 1.66 which is an elevation for this patient and fluid resuscitation begun, AST 73, ALT 87, urinalysis unremarkable.  Lactic acid normal.  COVID, flu, RSV negative.  Patient meets SIRS criteria and blood cultures obtained and patient started on cefepime .  CT abdomen pelvis negative for acute intra-abdominal pathology.  I spoke with the gastroenterologist on-call Dr. Eartha who states that with the polypectomy of this size patient is at risk for bacterial  translocation and as he has failed outpatient oral antibiotics he should be on IV antibiotics until his cultures results.  Patient then admitted on cefepime    Additional history obtained: -Additional history obtained from wife -External records from outside source obtained and reviewed including: Chart review including previous notes, labs, imaging, consultation notes   Lab Tests: -I ordered, reviewed, and interpreted labs.   The pertinent results include:  Labs Reviewed  COMPREHENSIVE METABOLIC PANEL WITH GFR - Abnormal; Notable for the following components:      Result Value   Sodium 131 (*)    Potassium 3.2 (*)    CO2 20 (*)    Glucose, Bld 160 (*)    BUN 36 (*)    Creatinine, Ser 1.66 (*)    AST 73 (*)    ALT 87 (*)    GFR, Estimated 45 (*)    All other components within normal limits  CBC WITH DIFFERENTIAL/PLATELET - Abnormal; Notable for the following components:   WBC 14.6 (*)    Neutro Abs 12.9 (*)    Lymphs Abs 0.6 (*)    Abs Immature Granulocytes 0.08 (*)    All other components within normal limits  URINALYSIS, ROUTINE W REFLEX MICROSCOPIC - Abnormal; Notable for the following components:   Specific Gravity, Urine 1.045 (*)    Protein, ur 30 (*)    All other components within normal limits  RESP PANEL BY RT-PCR (RSV, FLU A&B, COVID)  RVPGX2  CULTURE, BLOOD (ROUTINE X 2)  CULTURE, BLOOD (ROUTINE X 2)  LACTIC ACID, PLASMA  LACTIC ACID, PLASMA      EKG   EKG Interpretation Date/Time:  Sunday March 01 2024 17:46:41 EDT Ventricular Rate:  105 PR Interval:  143 QRS Duration:  92 QT Interval:  319 QTC Calculation: 422 R Axis:   -22  Text Interpretation: Sinus tachycardia Multiple ventricular premature complexes Consider anterior infarct Confirmed by Dewie Ahart ([id number]) on 03/01/2024 8:59:45 PM         Imaging Studies ordered: I ordered imaging studies including CTA I independently visualized and interpreted imaging. I agree with the radiologist  interpretation   Medicines ordered and prescription drug management: Meds ordered this encounter  Medications   ceFEPIme  (MAXIPIME ) 2 g in sodium chloride  0.9 % 100 mL IVPB   iohexol  (OMNIPAQUE ) 300 MG/ML solution 80 mL   lactated ringers  bolus 1,000 mL   acetaminophen  (TYLENOL ) tablet 1,000 mg    -I have reviewed the patients home medicines and have made adjustments as needed  Critical interventions Fluids, antibiotics  Consultations Obtained: I requested consultation with the gastroenterologist on-call,  and discussed lab and imaging findings as well as pertinent plan - they recommend: Hospital mission   Cardiac Monitoring: The patient was maintained on a cardiac monitor.  I personally viewed and interpreted the cardiac monitored which showed an underlying rhythm of: Sinus tachycardia with PVCs  Social Determinants of Health:  Factors impacting patients care include: none   Reevaluation: After the interventions noted above, I reevaluated the patient and found that they have :improved  Co morbidities that complicate the patient evaluation  Past Medical History:  Diagnosis Date   Arthritis    Hernia of abdominal wall    History of kidney stones    Hypertension    Left ureteral calculus    Type 2 diabetes mellitus (HCC)       Dispostion: I considered admission for this patient, and patient require hospital admission for fever and concern for bacteremia     Final Clinical Impression(s) / ED Diagnoses Final diagnoses:  Fever, unspecified fever cause     @PCDICTATION @    Albertina Dixon, MD 03/01/24 2119

## 2024-03-02 DIAGNOSIS — R7881 Bacteremia: Secondary | ICD-10-CM | POA: Diagnosis not present

## 2024-03-02 LAB — CBC
HCT: 40 % (ref 39.0–52.0)
Hemoglobin: 13.6 g/dL (ref 13.0–17.0)
MCH: 29.6 pg (ref 26.0–34.0)
MCHC: 34 g/dL (ref 30.0–36.0)
MCV: 87.1 fL (ref 80.0–100.0)
Platelets: 240 K/uL (ref 150–400)
RBC: 4.59 MIL/uL (ref 4.22–5.81)
RDW: 12.7 % (ref 11.5–15.5)
WBC: 15 K/uL — ABNORMAL HIGH (ref 4.0–10.5)
nRBC: 0 % (ref 0.0–0.2)

## 2024-03-02 LAB — HEPATITIS PANEL, ACUTE
HCV Ab: NONREACTIVE
Hep A IgM: NONREACTIVE
Hep B C IgM: NONREACTIVE
Hepatitis B Surface Ag: NONREACTIVE

## 2024-03-02 LAB — COMPREHENSIVE METABOLIC PANEL WITH GFR
ALT: 95 U/L — ABNORMAL HIGH (ref 0–44)
AST: 74 U/L — ABNORMAL HIGH (ref 15–41)
Albumin: 3.2 g/dL — ABNORMAL LOW (ref 3.5–5.0)
Alkaline Phosphatase: 54 U/L (ref 38–126)
Anion gap: 12 (ref 5–15)
BUN: 34 mg/dL — ABNORMAL HIGH (ref 8–23)
CO2: 20 mmol/L — ABNORMAL LOW (ref 22–32)
Calcium: 8.6 mg/dL — ABNORMAL LOW (ref 8.9–10.3)
Chloride: 101 mmol/L (ref 98–111)
Creatinine, Ser: 1.74 mg/dL — ABNORMAL HIGH (ref 0.61–1.24)
GFR, Estimated: 42 mL/min — ABNORMAL LOW (ref >60)
Glucose, Bld: 165 mg/dL — ABNORMAL HIGH (ref 70–99)
Potassium: 3.4 mmol/L — ABNORMAL LOW (ref 3.5–5.1)
Sodium: 133 mmol/L — ABNORMAL LOW (ref 135–145)
Total Bilirubin: 1.6 mg/dL — ABNORMAL HIGH (ref 0.0–1.2)
Total Protein: 6.3 g/dL — ABNORMAL LOW (ref 6.5–8.1)

## 2024-03-02 LAB — PROTIME-INR
INR: 1.3 — ABNORMAL HIGH (ref 0.8–1.2)
Prothrombin Time: 16.4 s — ABNORMAL HIGH (ref 11.4–15.2)

## 2024-03-02 LAB — HIV ANTIBODY (ROUTINE TESTING W REFLEX): HIV Screen 4th Generation wRfx: NONREACTIVE

## 2024-03-02 LAB — SURGICAL PATHOLOGY

## 2024-03-02 LAB — SODIUM, URINE, RANDOM: Sodium, Ur: 27 mmol/L

## 2024-03-02 LAB — BILIRUBIN, DIRECT: Bilirubin, Direct: 0.3 mg/dL — ABNORMAL HIGH (ref 0.0–0.2)

## 2024-03-02 LAB — PHOSPHORUS: Phosphorus: 1.9 mg/dL — ABNORMAL LOW (ref 2.5–4.6)

## 2024-03-02 LAB — OSMOLALITY: Osmolality: 291 mosm/kg (ref 275–295)

## 2024-03-02 LAB — CK: Total CK: 93 U/L (ref 49–397)

## 2024-03-02 LAB — MAGNESIUM: Magnesium: 1.7 mg/dL (ref 1.7–2.4)

## 2024-03-02 LAB — OSMOLALITY, URINE: Osmolality, Ur: 791 mosm/kg (ref 300–900)

## 2024-03-02 MED ORDER — SODIUM CHLORIDE 0.9 % IV BOLUS
500.0000 mL | Freq: Once | INTRAVENOUS | Status: AC
  Administered 2024-03-02: 500 mL via INTRAVENOUS

## 2024-03-02 MED ORDER — SODIUM CHLORIDE 0.9 % IV SOLN: INTRAVENOUS | Status: AC

## 2024-03-02 MED ORDER — CARVEDILOL 3.125 MG PO TABS
6.2500 mg | ORAL_TABLET | Freq: Two times a day (BID) | ORAL | Status: DC
  Administered 2024-03-02: 6.25 mg via ORAL
  Filled 2024-03-02 (×2): qty 2

## 2024-03-02 MED ORDER — HYDRALAZINE HCL 20 MG/ML IJ SOLN: 10.0000 mg | Freq: Four times a day (QID) | INTRAMUSCULAR | Status: DC | PRN

## 2024-03-02 NOTE — Progress Notes (Signed)
 PROGRESS NOTE  Anthony Frederick, is a 68 y.o. male, DOB - 06-02-1956, FMW:[id number]  Admit date - 03/01/2024   Admitting Physician Posey Maier, DO  Outpatient Primary MD for the patient is Shona Norleen PEDLAR, MD  LOS - 1  Chief Complaint  Patient presents with   Fever      Brief Narrative:   68 y.o. male with medical history significant of hypertension, type 2 diabetes mellitus, arthritis , nephrolithiasis admitted on 03/01/2024 with fevers after colonoscopy with polypectomy on 02/28/2024   -Assessment and Plan: 1)Fever of Unknown Origin---pt met SIRS criteria on admission-with fever, tachycardia, tachypnea and leukocytosis -Patient underwent underwent colonoscopy 02/28/24 with multiple polyps and one large 30 mm carpet-like, multilobulated rectal polyp removed with mucosal resection and sent home on Augmentin  BID for 3 days  prophylactically due to large resection  --Tmax 101.7 T-current 98.7 -- Blood cultures from 03/01/2024 NGTD WBC 14.6 >>15.0 - Total CK 93 -- Continue IV zosyn  and pending further culture data  2)Colon Polyps---Colonoscopy on 02/28/24: non-bleeding internal hemorrhoids, diverticulosis, two 4-5 m polyps in transverse, one 5 mm polyp in rectum, one 30 mm carpet-like and multilobulated polyp in rectum s/p removal with mucosal resection--pathology showed Tubular adenoma in the transverse colon and tubular adenoma with focal features suggestive of early traditional serrated adenoma in the rectum--negative for high-grade dysplasia or malignancy  3)Transaminitis--??  Etiology - Denies EtOH use -Acute blood hepatitis profile. -??  If patient had transient hypotension in the setting of sepsis -CT abdomen and pelvis on admission shows fatty infiltration of the liver - Monitor closely  4)Abnormal Pulmonary Nodule --- CT showed right part-solid pulmonary nodule measuring 7 mm  -Repeat noncontrast CT chest in 3 to 6 months advised  5)AKI----acute kidney injury    creatinine on  admission= 1.66 ,  baseline creatinine = 1.0    ,  creatinine is now=1.74  - Patient had contrast exposure with CT on 03/02/2023 --Hold losartan   renally adjust medications, avoid nephrotoxic agents / dehydration  / hypotension  6)HTN--hold losartan  due to AKI concerns - Give Coreg , continue Cardizem --- watch heart rate closely - Needs IV hydralazine  as needed elevated BP  7)DM2- Use Novolog/Humalog Sliding scale insulin with Accu-Cheks/Fingersticks as ordered   8)Class 2 Obesity--- abdominal imaging study with fatty liver -Low calorie diet, portion control and increase physical activity discussed with patient -Body mass index is 36.47 kg/m.   Status is: Inpatient   Disposition: The patient is from: Home              Anticipated d/c is to: Home              Anticipated d/c date is: 1 day              Patient currently is not medically stable to d/c. Barriers: Not Clinically Stable-   Code Status :  -  Code Status: Full Code   Family Communication:   (patient is alert, awake and coherent)   Discussed with his wife at bedside  DVT Prophylaxis  :   - SCDs   enoxaparin  (LOVENOX ) injection 40 mg Start: 03/02/24 1000 SCDs Start: 03/01/24 2339   Lab Results  Component Value Date   PLT 240 03/02/2024   Inpatient Medications  Scheduled Meds:  diltiazem   360 mg Oral QHS   enoxaparin  (LOVENOX ) injection  40 mg Subcutaneous Q24H   losartan   100 mg Oral Daily   Continuous Infusions:  piperacillin -tazobactam (ZOSYN )  IV 3.375 g (03/02/24 1316)  PRN Meds:.ondansetron  **OR** ondansetron  (ZOFRAN ) IV   Anti-infectives (From admission, onward)    Start     Dose/Rate Route Frequency Ordered Stop   03/01/24 2345  piperacillin -tazobactam (ZOSYN ) IVPB 3.375 g        3.375 g 12.5 mL/hr over 240 Minutes Intravenous Every 8 hours 03/01/24 2338     03/01/24 1945  ceFEPIme  (MAXIPIME ) 2 g in sodium chloride  0.9 % 100 mL IVPB        2 g 200 mL/hr over 30 Minutes Intravenous  Once  03/01/24 1932 03/01/24 2100        Subjective: Anthony Frederick today has no emesis,  No chest pain,    - Patient wife and GI provider Dr. Cindie at bedside - Denies abdominal pain - No further fevers - Voiding okay  Objective: Vitals:   03/01/24 2150 03/02/24 0537 03/02/24 0800 03/02/24 1429  BP: (!) 152/68 (!) 88/55 (!) 107/54 (!) 153/127  Pulse: (!) 105 95 93 96  Resp: 19 18 18 19   Temp: 98.2 F (36.8 C) 99.7 F (37.6 C) 98.7 F (37.1 C) 98.7 F (37.1 C)  TempSrc: Oral Oral Oral Oral  SpO2: 99% 91% 95% 96%  Weight:      Height:       No intake or output data in the 24 hours ending 03/02/24 1711 Filed Weights   03/01/24 1741 03/01/24 2133  Weight: 116.1 kg 118.6 kg   Physical Exam Gen:- Awake Alert,  in no apparent distress  HEENT:- [place].AT, No sclera icterus Neck-Supple Neck,No JVD,.  Lungs-  CTAB , fair symmetrical air movement CV- S1, S2 normal, regular  Abd-  +ve B.Sounds, Abd Soft, No tenderness, increased adiposity Extremity/Skin:- No  edema, pedal pulses present , erythematous rash especially on lower extremities Psych-affect is appropriate, oriented x3 Neuro-no new focal deficits, no tremors  Data Reviewed: I have personally reviewed following labs and imaging studies  CBC: Recent Labs  Lab 03/01/24 1746 03/02/24 0524  WBC 14.6* 15.0*  NEUTROABS 12.9*  --   HGB 13.6 13.6  HCT 40.3 40.0  MCV 87.0 87.1  PLT 277 240   Basic Metabolic Panel: Recent Labs  Lab 03/01/24 1746 03/02/24 0524  NA 131* 133*  K 3.2* 3.4*  CL 98 101  CO2 20* 20*  GLUCOSE 160* 165*  BUN 36* 34*  CREATININE 1.66* 1.74*  CALCIUM 8.9 8.6*  MG  --  1.7  PHOS  --  1.9*   GFR: Estimated Creatinine Clearance: 54 mL/min (A) (by C-G formula based on SCr of 1.74 mg/dL (H)). Liver Function Tests: Recent Labs  Lab 03/01/24 1746 03/02/24 0524  AST 73* 74*  ALT 87* 95*  ALKPHOS 55 54  BILITOT 1.1 1.6*  PROT 6.8 6.3*  ALBUMIN 3.5 3.2*   Cardiac Enzymes: Recent Labs   Lab 03/02/24 1523  CKTOTAL 93   Recent Results (from the past 240 hours)  Resp panel by RT-PCR (RSV, Flu A&B, Covid) Anterior Nasal Swab     Status: None   Collection Time: 03/01/24  5:46 PM   Specimen: Anterior Nasal Swab  Result Value Ref Range Status   SARS Coronavirus 2 by RT PCR NEGATIVE NEGATIVE Final    Comment: (NOTE) SARS-CoV-2 target nucleic acids are NOT DETECTED.  The SARS-CoV-2 RNA is generally detectable in upper respiratory specimens during the acute phase of infection. The lowest concentration of SARS-CoV-2 viral copies this assay can detect is 138 copies/mL. A negative result does not preclude SARS-Cov-2 infection and should not  be used as the sole basis for treatment or other patient management decisions. A negative result may occur with  improper specimen collection/handling, submission of specimen other than nasopharyngeal swab, presence of viral mutation(s) within the areas targeted by this assay, and inadequate number of viral copies(<138 copies/mL). A negative result must be combined with clinical observations, patient history, and epidemiological information. The expected result is Negative.  Fact Sheet for Patients:  BloggerCourse.com  Fact Sheet for Healthcare Providers:  SeriousBroker.it  This test is no t yet approved or cleared by the [place]  FDA and  has been authorized for detection and/or diagnosis of SARS-CoV-2 by FDA under an Emergency Use Authorization (EUA). This EUA will remain  in effect (meaning this test can be used) for the duration of the COVID-19 declaration under Section 564(b)(1) of the Act, 21 U.S.C.section 360bbb-3(b)(1), unless the authorization is terminated  or revoked sooner.       Influenza A by PCR NEGATIVE NEGATIVE Final   Influenza B by PCR NEGATIVE NEGATIVE Final    Comment: (NOTE) The Xpert Xpress SARS-CoV-2/FLU/RSV plus assay is intended as an aid in the  diagnosis of influenza from Nasopharyngeal swab specimens and should not be used as a sole basis for treatment. Nasal washings and aspirates are unacceptable for Xpert Xpress SARS-CoV-2/FLU/RSV testing.  Fact Sheet for Patients: BloggerCourse.com  Fact Sheet for Healthcare Providers: SeriousBroker.it  This test is not yet approved or cleared by the [place]  FDA and has been authorized for detection and/or diagnosis of SARS-CoV-2 by FDA under an Emergency Use Authorization (EUA). This EUA will remain in effect (meaning this test can be used) for the duration of the COVID-19 declaration under Section 564(b)(1) of the Act, 21 U.S.C. section 360bbb-3(b)(1), unless the authorization is terminated or revoked.     Resp Syncytial Virus by PCR NEGATIVE NEGATIVE Final    Comment: (NOTE) Fact Sheet for Patients: BloggerCourse.com  Fact Sheet for Healthcare Providers: SeriousBroker.it  This test is not yet approved or cleared by the [place]  FDA and has been authorized for detection and/or diagnosis of SARS-CoV-2 by FDA under an Emergency Use Authorization (EUA). This EUA will remain in effect (meaning this test can be used) for the duration of the COVID-19 declaration under Section 564(b)(1) of the Act, 21 U.S.C. section 360bbb-3(b)(1), unless the authorization is terminated or revoked.  Performed at [facility], [address] [place], [place] [address]   Blood culture (routine x 2)     Status: None (Preliminary result)   Collection Time: 03/01/24  5:48 PM   Specimen: BLOOD RIGHT WRIST  Result Value Ref Range Status   Specimen Description   Final    BLOOD RIGHT WRIST BOTTLES DRAWN AEROBIC AND ANAEROBIC   Special Requests Blood Culture adequate volume  Final   Culture   Final    NO GROWTH < 24 HOURS Performed at [facility], [address] [place], [place] [address]     Report Status PENDING  Incomplete  Blood culture (routine x 2)     Status: None (Preliminary result)   Collection Time: 03/01/24  5:53 PM   Specimen: BLOOD LEFT FOREARM  Result Value Ref Range Status   Specimen Description   Final    BLOOD LEFT FOREARM BOTTLES DRAWN AEROBIC AND ANAEROBIC   Special Requests Blood Culture adequate volume  Final   Culture   Final    NO GROWTH < 24 HOURS Performed at [facility], [address], [place], [place] [address]  Report Status PENDING  Incomplete    Radiology Studies: CT ABDOMEN PELVIS W CONTRAST Result Date: 03/01/2024 CLINICAL DATA:  Left lower quadrant pain, history of recent colonoscopy with polyp removal EXAM: CT ABDOMEN AND PELVIS WITH CONTRAST TECHNIQUE: Multidetector CT imaging of the abdomen and pelvis was performed using the standard protocol following bolus administration of intravenous contrast. RADIATION DOSE REDUCTION: This exam was performed according to the departmental dose-optimization program which includes automated exposure control, adjustment of the mA and/or kV according to patient size and/or use of iterative reconstruction technique. CONTRAST:  80mL OMNIPAQUE  IOHEXOL  300 MG/ML  SOLN COMPARISON:  08/05/2017 FINDINGS: Lower chest: 7 mm sub solid nodule is noted posteriorly in the right lower lobe. No other nodules are seen. No effusion is noted. Hepatobiliary: Fatty infiltration of the liver is noted. The gallbladder is within normal limits. Pancreas: Unremarkable. No pancreatic ductal dilatation or surrounding inflammatory changes. Spleen: Normal in size without focal abnormality. Adrenals/Urinary Tract: Adrenal glands are within normal limits. Kidneys demonstrate a normal enhancement pattern bilaterally. Punctate nonobstructing right renal stone is seen. No obstructive changes are seen. The bladder is within normal limits. Stomach/Bowel: Mild diverticular changes noted in the colon. The appendix is within normal limits. No small bowel  or gastric abnormality is noted. Vascular/Lymphatic: Aortic atherosclerosis. No enlarged abdominal or pelvic lymph nodes. Reproductive: Prostate is unremarkable. Other: Bilateral fat containing inguinal hernias are noted stable from the prior exam. Previously seen umbilical hernia has been repaired. No free fluid is noted. Musculoskeletal: There are changes of lumbar spine are noted. No acute bony abnormality is seen. IMPRESSION: Punctate nonobstructing right renal stone. Diverticulosis without diverticulitis. Bilateral fat containing inguinal hernias. Right part-solid pulmonary nodule measuring 7 mm. Per Fleischner Society Guidelines, recommend a non-contrast Chest CT at 3-6 months to confirm persistence. If unchanged and the solid component remains < 6 mm, an annual non-contrast Chest CT should be performed for 5 years. If the solid component is 6-8 mm on follow-up, recommend biopsy or resection. If the solid component is > 8 mm on follow-up, recommend PET/CT, biopsy or resection. These guidelines do not apply to immunocompromised patients and patients with cancer. Follow up in patients with significant comorbidities as clinically warranted. For lung cancer screening, adhere to Lung-RADS guidelines. Reference: Radiology. 2017; 284(1):228-43. Electronically Signed   By: Oneil Devonshire M.D.   On: 03/01/2024 20:04   Scheduled Meds:  diltiazem   360 mg Oral QHS   enoxaparin  (LOVENOX ) injection  40 mg Subcutaneous Q24H   losartan   100 mg Oral Daily   Continuous Infusions:  piperacillin -tazobactam (ZOSYN )  IV 3.375 g (03/02/24 1316)    LOS: 1 day   Rendall Carwin M.D on 03/02/2024 at 5:11 PM  Go to [email or web address] - for contact info  [facility] - Office  [phone]  If 7PM-7AM, please contact night-coverage [email or web address] 03/02/2024, 5:11 PM

## 2024-03-02 NOTE — Plan of Care (Signed)
   Problem: Education: Goal: Knowledge of General Education information will improve Description: Including pain rating scale, medication(s)/side effects and non-pharmacologic comfort measures Outcome: Progressing   Problem: Clinical Measurements: Goal: Ability to maintain clinical measurements within normal limits will improve Outcome: Progressing Goal: Diagnostic test results will improve Outcome: Progressing

## 2024-03-02 NOTE — Consult Note (Signed)
 Gastroenterology Consult   Referring Provider: Dr. Pearlean Primary Care Physician:  Shona Norleen PEDLAR, MD Primary Gastroenterologist:  Dr. Cindie  Patient ID: Anthony Frederick; [id number]; 1955-08-06   Admit date: 03/01/2024  LOS: 1 day   Date of Consultation: 03/02/2024  Reason for Consultation:  Fever, sepsis, recent polypectomy  History of Present Illness   Anthony Frederick is a 68 y.o. year old male with history of arthritis, HTN, renal stones, DM, + Cologuard as outpatient and underwent colonoscopy 02/28/24 with multiple polyps and one large 30 mm carpet-like, multilobulated rectal polyp removed with mucosal resection and sent home on Augmentin  BID for 3 days prophylactically due to large resection. Presented to the ED yesterday with acute onset of fever, chills, and met criteria for sepsis. He was admitted and started on IV abx and supportive measures. GI now consulted due to recent polypectomy and concern for bacteremia.   In the ED: temp 101.7 on admission, most recent 98.7. WBC count 14.6 on admission, lactic acid normal at 1.4, negative Covid and flu, blood cultures drawn and no growth thus far, hyponatremia and hypokalemia noted, creatinine at 1.66, up from baseline of low 1s, appeared to be dehydrated. AST 73, ALT 87, today AST 74, ALT 95. Tbili 1.6, direct 0.3, indirect 1.3. CT with nonobstructing right renal stone, diverticulosis, bilateral inguinal hernias, right part-solid pulmonary nodule.   Patient denies any abdominal pain, N/V. States only symptom was fever and chills starting about 24 hours after procedure. He has finished augmentin  with last dose yesterday. Denies any rash at home but states noted rash on lower legs and abdomen following CT. No pruritus.   No hx of liver disease. No ETOH use. No known hx of elevated LFTs. No abdominal pain, N/V, overt GI bleeding. No mental status changes or confusion.   New medications: abx after procedure, finished last pill yesterday.    Afebrile today. Continues to deny any abdominal pain. Tolerated clear liquids. Hungry and desires to advance diet. Wife at bedside.    Colonoscopy 02/28/24: non-bleeding internal hemorrhoids, diverticulosis, two 4-5 m polyps in transverse, one 5 mm polyp in rectum, one 30 mm carpet-like and multilobulated polyp in rectum s/p removal with mucosal resection, path pending   Past Medical History:  Diagnosis Date   Arthritis    Hernia of abdominal wall    History of kidney stones    Hypertension    Left ureteral calculus    Type 2 diabetes mellitus (HCC)     Past Surgical History:  Procedure Laterality Date   ANKLE FRACTURE SURGERY Left    COLONOSCOPY N/A 02/28/2024   Procedure: COLONOSCOPY;  Surgeon: Cindie Carlin POUR, DO;  Location: [facility];  Service: Endoscopy;  Laterality: N/A;  11:30 am, asa 2   CYSTOSCOPY W/ RETROGRADES Left 04/19/2014   Procedure: CYSTOSCOPY WITH RETROGRADE PYELOGRAM/ INTERPRETATION;  Surgeon: Arlena LILLETTE Gal, MD;  Location: [facility];  Service: Urology;  Laterality: Left;   CYSTOSCOPY WITH STENT PLACEMENT Left 04/19/2014   Procedure: CYSTOSCOPY WITH STENT PLACEMENT;  Surgeon: Arlena LILLETTE Gal, MD;  Location: [facility];  Service: Urology;  Laterality: Left;   CYSTOSCOPY WITH URETEROSCOPY Left 04/19/2014   Procedure: CYSTOSCOPY WITH URETEROSCOPY/ BASKET EXTRACTION;  Surgeon: Arlena LILLETTE Gal, MD;  Location: [facility];  Service: Urology;  Laterality: Left;   RADIAL HEAD ARTHROPLASTY Left 10/27/2019   Procedure: LEFT RADIAL HEAD ARTHROPLASTY;  Surgeon: Josefina Chew, MD;  Location: [facility];  Service: Orthopedics;  Laterality: Left;   removal axillary cyst Left    right knee replacement     UMBILICAL HERNIA REPAIR N/A 08/19/2017   Procedure: HERNIA REPAIR UMBILICAL ADULT WITH MESH;  Surgeon: Kallie Manuelita BROCKS, MD;  Location: [facility];  Service: General;  Laterality: N/A;    Prior to Admission  medications   Medication Sig Start Date End Date Taking? Authorizing Provider  acetaminophen  (TYLENOL ) 500 MG tablet Take 1 tablet (500 mg total) by mouth every 6 (six) hours as needed for mild pain or headache. 10/27/19  Yes Brown, Blaine K, PA-C  amoxicillin -clavulanate (AUGMENTIN ) 875-125 MG tablet Take 1 tablet by mouth 2 (two) times daily for 3 days. 02/28/24 03/02/24 Yes Carver, Carlin POUR, DO  diltiazem  (CARDIZEM  CD) 360 MG 24 hr capsule Take 360 mg by mouth at bedtime. 01/25/24  Yes [provider]  JANUVIA 100 MG tablet Take 100 mg by mouth daily.   Yes [provider]  losartan -hydrochlorothiazide (HYZAAR) 100-25 MG tablet Take 1 tablet by mouth daily.   Yes [provider]  metFORMIN (GLUCOPHAGE) 1000 MG tablet Take 1,000 mg by mouth 2 (two) times daily.   Yes [provider]    Current Facility-Administered Medications  Medication Dose Route Frequency Provider Last Rate Last Admin   diltiazem  (CARDIZEM  CD) 24 hr capsule 360 mg  360 mg Oral QHS Adefeso, Oladapo, DO   360 mg at 03/02/24 0056   enoxaparin  (LOVENOX ) injection 40 mg  40 mg Subcutaneous Q24H Adefeso, Oladapo, DO   40 mg at 03/02/24 9183   losartan  (COZAAR ) tablet 100 mg  100 mg Oral Daily Adefeso, Oladapo, DO   100 mg at 03/02/24 9182   ondansetron  (ZOFRAN ) tablet 4 mg  4 mg Oral Q6H PRN Adefeso, Oladapo, DO       Or   ondansetron  (ZOFRAN ) injection 4 mg  4 mg Intravenous Q6H PRN Adefeso, Oladapo, DO       piperacillin -tazobactam (ZOSYN ) IVPB 3.375 g  3.375 g Intravenous Q8H Clair Lynwood CROME, RPH 12.5 mL/hr at 03/02/24 0610 3.375 g at 03/02/24 0610    Allergies as of 03/01/2024 - Review Complete 03/01/2024  Allergen Reaction Noted   Levaquin [levofloxacin] Rash 10/21/2019    Family History  Problem Relation Age of Onset   Diabetes Mother    Melanoma Father    Colon cancer Neg Hx     Social History   Socioeconomic History   Marital status: Married    Spouse name: Not on file    Number of children: 1   Years of education: 12   Highest education level: Not on file  Occupational History   Not on file  Tobacco Use   Smoking status: Former    Current packs/day: 0.00    Types: Cigarettes    Quit date: 07/09/1958    Years since quitting: 65.6   Smokeless tobacco: Never  Vaping Use   Vaping status: Never Used  Substance and Sexual Activity   Alcohol use: No   Drug use: No   Sexual activity: Yes    Birth control/protection: None  Other Topics Concern   Not on file  Social History Narrative   Not on file   Social Drivers of Health   Financial Resource Strain: Not on file  Food Insecurity: No Food Insecurity (03/01/2024)   Hunger Vital Sign    Worried About Running Out of Food in the Last Year: Never true    Ran Out of Food in the Last Year: Never true  Transportation Needs: No Transportation Needs (03/01/2024)   PRAPARE - Administrator, Civil Service (Medical): No    Lack of Transportation (Non-Medical): No  Physical Activity: Not on file  Stress: Not on file  Social Connections: Moderately Isolated (03/01/2024)   Social Connection and Isolation Panel    Frequency of Communication with Friends and Family: More than three times a week    Frequency of Social Gatherings with Friends and Family: More than three times a week    Attends Religious Services: Never    Database administrator or Organizations: No    Attends Banker Meetings: Never    Marital Status: Married  Catering manager Violence: Not At Risk (03/01/2024)   Humiliation, Afraid, Rape, and Kick questionnaire    Fear of Current or Ex-Partner: No    Emotionally Abused: No    Physically Abused: No    Sexually Abused: No     Review of Systems   Gen: Denies any fever, chills, loss of appetite, change in weight or weight loss CV: Denies chest pain, heart palpitations, syncope, edema  Resp: Denies shortness of breath with rest, cough, wheezing, coughing up blood, and  pleurisy. GI: Denies vomiting blood, jaundice, and fecal incontinence.   Denies dysphagia or odynophagia. GU : Denies urinary burning, blood in urine, urinary frequency, and urinary incontinence. MS: Denies joint pain, limitation of movement, swelling, cramps, and atrophy.  Derm: Denies rash, itching, dry skin, hives. Psych: Denies depression, anxiety, memory loss, hallucinations, and confusion. Heme: Denies bruising or bleeding Neuro:  Denies any headaches, dizziness, paresthesias, shaking  Physical Exam   Vital Signs in last 24 hours: Temp:  [98.2 F (36.8 C)-101.7 F (38.7 C)] 98.7 F (37.1 C) (08/25 0800) Pulse Rate:  [67-105] 93 (08/25 0800) Resp:  [16-26] 18 (08/25 0800) BP: (88-152)/(54-83) 107/54 (08/25 0800) SpO2:  [91 %-99 %] 95 % (08/25 0800) Weight:  [116.1 kg-118.6 kg] 118.6 kg (08/24 2133) Last BM Date : 03/01/24  General:   Alert,  Well-developed, well-nourished, pleasant and cooperative in NAD Head:  Normocephalic and atraumatic. Eyes:  Sclera clear, no icterus.    Ears:  Normal auditory acuity. Lungs:  Clear throughout to auscultation.   Heart:  S1 S2 present no murmurs Abdomen:  Soft, nontender and nondistended. Sitting up in bed with limited exam, eating breakfast Rectal: deferred   Msk:  Symmetrical without gross deformities. Normal posture. Extremities:  Without edema. Neurologic:  Alert and  oriented x4. Skin:  Intact without significant lesions or rashes. Psych:  Alert and cooperative. Normal mood and affect.  Intake/Output from previous day: No intake/output data recorded. Intake/Output this shift: No intake/output data recorded.   Labs/Studies   Recent Labs Recent Labs    03/01/24 1746 03/02/24 0524  WBC 14.6* 15.0*  HGB 13.6 13.6  HCT 40.3 40.0  PLT 277 240   BMET Recent Labs    03/01/24 1746 03/02/24 0524  NA 131* 133*  K 3.2* 3.4*  CL 98 101  CO2 20* 20*  GLUCOSE 160* 165*  BUN 36* 34*  CREATININE 1.66* 1.74*  CALCIUM 8.9  8.6*   LFT Recent Labs    03/01/24 1746 03/02/24 0524  PROT 6.8 6.3*  ALBUMIN 3.5 3.2*  AST 73* 74*  ALT 87* 95*  ALKPHOS 55 54  BILITOT 1.1 1.6*     Radiology/Studies CT ABDOMEN PELVIS W CONTRAST Result Date: 03/01/2024 CLINICAL DATA:  Left lower quadrant pain, history of recent colonoscopy with polyp removal EXAM:  CT ABDOMEN AND PELVIS WITH CONTRAST TECHNIQUE: Multidetector CT imaging of the abdomen and pelvis was performed using the standard protocol following bolus administration of intravenous contrast. RADIATION DOSE REDUCTION: This exam was performed according to the departmental dose-optimization program which includes automated exposure control, adjustment of the mA and/or kV according to patient size and/or use of iterative reconstruction technique. CONTRAST:  80mL OMNIPAQUE  IOHEXOL  300 MG/ML  SOLN COMPARISON:  08/05/2017 FINDINGS: Lower chest: 7 mm sub solid nodule is noted posteriorly in the right lower lobe. No other nodules are seen. No effusion is noted. Hepatobiliary: Fatty infiltration of the liver is noted. The gallbladder is within normal limits. Pancreas: Unremarkable. No pancreatic ductal dilatation or surrounding inflammatory changes. Spleen: Normal in size without focal abnormality. Adrenals/Urinary Tract: Adrenal glands are within normal limits. Kidneys demonstrate a normal enhancement pattern bilaterally. Punctate nonobstructing right renal stone is seen. No obstructive changes are seen. The bladder is within normal limits. Stomach/Bowel: Mild diverticular changes noted in the colon. The appendix is within normal limits. No small bowel or gastric abnormality is noted. Vascular/Lymphatic: Aortic atherosclerosis. No enlarged abdominal or pelvic lymph nodes. Reproductive: Prostate is unremarkable. Other: Bilateral fat containing inguinal hernias are noted stable from the prior exam. Previously seen umbilical hernia has been repaired. No free fluid is noted. Musculoskeletal:  There are changes of lumbar spine are noted. No acute bony abnormality is seen. IMPRESSION: Punctate nonobstructing right renal stone. Diverticulosis without diverticulitis. Bilateral fat containing inguinal hernias. Right part-solid pulmonary nodule measuring 7 mm. Per Fleischner Society Guidelines, recommend a non-contrast Chest CT at 3-6 months to confirm persistence. If unchanged and the solid component remains < 6 mm, an annual non-contrast Chest CT should be performed for 5 years. If the solid component is 6-8 mm on follow-up, recommend biopsy or resection. If the solid component is > 8 mm on follow-up, recommend PET/CT, biopsy or resection. These guidelines do not apply to immunocompromised patients and patients with cancer. Follow up in patients with significant comorbidities as clinically warranted. For lung cancer screening, adhere to Lung-RADS guidelines. Reference: Radiology. 2017; 284(1):228-43. Electronically Signed   By: Oneil Devonshire M.D.   On: 03/01/2024 20:04     Assessment   Anthony Frederick is a 68 y.o. year old male  with history of arthritis, HTN, renal stones, DM, + Cologuard as outpatient and underwent colonoscopy 02/28/24 with multiple polyps and one large 30 mm carpet-like, multilobulated rectal polyp removed with mucosal resection and sent home on Augmentin  BID for 3 days prophylactically due to large resection. Presented to the ED yesterday with acute onset of fever, chills, admitted with sepsis and started on IV antibiotics with GI consulted due to recent polypectomy and concern for evolving bacteremia.    Interestingly, patient denies any abdominal pain, and fever has resolved. Unable to rule out a mild post-polypectomy syndrome type presentation but has continued to deny any abdominal pain and CT is unrevealing. Blood cultures no growth thus far. WBC count remains elevated at 15 (previously 14.6), but he was also notably dehydrated on presentation and afebrile currently. Plans  for continued supportive measures, IV antibiotics, and monitoring. Surgical pathology remains pending from colonoscopy.  Transaminases remaining elevated from admission, which is new from baseline. Suspect multifactorial in setting of acute illness, known hepatic steatosis, concern remains for DILI in setting of recent Augmentin , there is some mention of hypotension on presentation but I am unable to see this. I do note he had soft BPs this morning while laying down  but now resolved. Non-pruritic rash also noted on lower legs and abdomen, which patient noticed following CT scan. Unclear if this was r/t CT or if is related to augmentin  exposure, with a component of DILI presentation. Denies any use of ETOH, new medications other than Augmentin . Will check INR and hepatitis panel for baseline and continue to trend LFTs. Conjugated bilirubin predominantly indirect.    Plan / Recommendations    Soft diet Continue IV antibiotics, follow blood cultures Trend HFP, check INR, hepatitis panel Supportive measures for now, blood cultures pending Surgical pathology pending Follow-up lung nodule as outpatient with noncontrast CT at 3-6 months     03/02/2024, 8:57 AM  Therisa MICAEL Stager, PhD, ANP-BC [facility]

## 2024-03-02 NOTE — Progress Notes (Signed)
   03/02/24 1154  TOC Brief Assessment  Insurance and Status Reviewed  Patient has primary care physician Yes  Home environment has been reviewed From home  Prior level of function: Independent  Prior/Current Home Services No current home services  Social Drivers of Health Review SDOH reviewed no interventions necessary  Readmission risk has been reviewed Yes  Transition of care needs no transition of care needs at this time   Transition of Care Department [facility]) has reviewed patient and no TOC needs have been identified at this time. We will continue to monitor patient advancement through interdisciplinary progression rounds. If new patient transition needs arise, please place a TOC consult.

## 2024-03-03 DIAGNOSIS — R7989 Other specified abnormal findings of blood chemistry: Secondary | ICD-10-CM | POA: Diagnosis not present

## 2024-03-03 DIAGNOSIS — R509 Fever, unspecified: Secondary | ICD-10-CM

## 2024-03-03 DIAGNOSIS — Z860101 Personal history of adenomatous and serrated colon polyps: Secondary | ICD-10-CM | POA: Diagnosis not present

## 2024-03-03 DIAGNOSIS — N179 Acute kidney failure, unspecified: Secondary | ICD-10-CM | POA: Diagnosis not present

## 2024-03-03 DIAGNOSIS — R7881 Bacteremia: Secondary | ICD-10-CM | POA: Diagnosis not present

## 2024-03-03 LAB — CBC
HCT: 37.4 % — ABNORMAL LOW (ref 39.0–52.0)
Hemoglobin: 12.7 g/dL — ABNORMAL LOW (ref 13.0–17.0)
MCH: 29.7 pg (ref 26.0–34.0)
MCHC: 34 g/dL (ref 30.0–36.0)
MCV: 87.6 fL (ref 80.0–100.0)
Platelets: 245 K/uL (ref 150–400)
RBC: 4.27 MIL/uL (ref 4.22–5.81)
RDW: 12.9 % (ref 11.5–15.5)
WBC: 8.1 K/uL (ref 4.0–10.5)
nRBC: 0 % (ref 0.0–0.2)

## 2024-03-03 LAB — COMPREHENSIVE METABOLIC PANEL WITH GFR
ALT: 222 U/L — ABNORMAL HIGH (ref 0–44)
AST: 160 U/L — ABNORMAL HIGH (ref 15–41)
Albumin: 3 g/dL — ABNORMAL LOW (ref 3.5–5.0)
Alkaline Phosphatase: 75 U/L (ref 38–126)
Anion gap: 12 (ref 5–15)
BUN: 42 mg/dL — ABNORMAL HIGH (ref 8–23)
CO2: 20 mmol/L — ABNORMAL LOW (ref 22–32)
Calcium: 8.5 mg/dL — ABNORMAL LOW (ref 8.9–10.3)
Chloride: 100 mmol/L (ref 98–111)
Creatinine, Ser: 2.14 mg/dL — ABNORMAL HIGH (ref 0.61–1.24)
GFR, Estimated: 33 mL/min — ABNORMAL LOW (ref >60)
Glucose, Bld: 209 mg/dL — ABNORMAL HIGH (ref 70–99)
Potassium: 3.1 mmol/L — ABNORMAL LOW (ref 3.5–5.1)
Sodium: 132 mmol/L — ABNORMAL LOW (ref 135–145)
Total Bilirubin: 1 mg/dL (ref 0.0–1.2)
Total Protein: 6.3 g/dL — ABNORMAL LOW (ref 6.5–8.1)

## 2024-03-03 MED ORDER — DILTIAZEM HCL ER COATED BEADS 180 MG PO CP24: 360.0000 mg | ORAL_CAPSULE | Freq: Every day | ORAL | Status: DC

## 2024-03-03 MED ORDER — SODIUM BICARBONATE 650 MG PO TABS
1300.0000 mg | ORAL_TABLET | Freq: Three times a day (TID) | ORAL | Status: DC
  Administered 2024-03-03 – 2024-03-04 (×4): 1300 mg via ORAL
  Filled 2024-03-03 (×4): qty 2

## 2024-03-03 MED ORDER — SODIUM CHLORIDE 0.9 % IV SOLN: INTRAVENOUS | Status: AC

## 2024-03-03 MED ORDER — POTASSIUM CHLORIDE CRYS ER 20 MEQ PO TBCR
40.0000 meq | EXTENDED_RELEASE_TABLET | ORAL | Status: AC
  Administered 2024-03-03 (×2): 40 meq via ORAL
  Filled 2024-03-03 (×2): qty 2

## 2024-03-03 NOTE — Progress Notes (Signed)
 Mobility Specialist Progress Note:    03/03/24 1000  Mobility  Activity Ambulated with assistance  Level of Assistance Independent  Assistive Device None  Distance Ambulated (ft) 800 ft  Range of Motion/Exercises Active;All extremities  Activity Response Tolerated well  Mobility Referral Yes  Mobility visit 1 Mobility  Mobility Specialist Start Time (ACUTE ONLY) 1000  Mobility Specialist Stop Time (ACUTE ONLY) 1020  Mobility Specialist Time Calculation (min) (ACUTE ONLY) 20 min   Pt received sitting EOB, family in room. Agreeable to mobility, independently able to stand and ambulate with no AD. Tolerated well,asx throughout. Returned sitting EOB, all needs met.  Kassy Mcenroe Mobility Specialist Please contact via Special educational needs teacher or  Rehab office at [phone]

## 2024-03-03 NOTE — Progress Notes (Signed)
 Mobility Specialist Progress Note:    03/03/24 1300  Mobility  Activity Ambulated with assistance  Level of Assistance Independent  Assistive Device None  Distance Ambulated (ft) 400 ft  Range of Motion/Exercises Active;All extremities  Activity Response Tolerated well  Mobility Referral Yes  Mobility visit 1 Mobility  Mobility Specialist Start Time (ACUTE ONLY) 1300  Mobility Specialist Stop Time (ACUTE ONLY) 1320  Mobility Specialist Time Calculation (min) (ACUTE ONLY) 20 min   Pt received sitting EOB, agreeable to mobility. Independently able to stand and ambulate with no AD. Tolerated well, asx throughout. Left pt in bathroom, all needs met.  Revin Corker Mobility Specialist Please contact via Special educational needs teacher or  Rehab office at [phone]

## 2024-03-03 NOTE — Progress Notes (Addendum)
 Subjective: Reports he feel fine. No abdominal pain, nausea, vomiting. Bowels moving normally. No brbpr or melena. No abdominal distension, LE edema, or mental status changes.    WBC count normalized LFTs up to AST 160, ALT 222. Bilirubin normalized. Creatinine bumbed to 2.14.  Acute hepatitis panel negative.  Blood culture without growth thus far. Colonoscopy surgical pathology showed tubular adenomas.  Rectal polyp with focal features suggestive of early traditional serrated adenoma.  LFTs wnl in July 2025 ([facility])  Objective: Vital signs in last 24 hours: Temp:  [98.2 F (36.8 C)-98.7 F (37.1 C)] 98.4 F (36.9 C) (08/26 0404) Pulse Rate:  [70-96] 72 (08/26 0404) Resp:  [18-20] 20 (08/26 0404) BP: (85-153)/(43-127) 102/67 (08/26 0845) SpO2:  [93 %-96 %] 93 % (08/26 0404) Last BM Date : 03/01/24 General:   Alert and oriented, pleasant, NAD.  Head:  Normocephalic and atraumatic. Eyes:  No icterus, sclera clear. Conjuctiva pink.  Heart:  S1, S2 present, no murmurs noted.  Lungs: Clear to auscultation bilaterally, without wheezing, rales, or rhonchi.  Abdomen:  Bowel sounds present, soft, non-tender, non-distended. No HSM or hernias noted. No rebound or guarding. No masses appreciated  Msk:  Symmetrical without gross deformities. Normal posture. Extremities:  Without edema. Neurologic:  Alert and  oriented x4;  grossly normal neurologically.  Psych: Normal mood and affect.  Intake/Output from previous day: 08/25 0701 - 08/26 0700 In: 1588.7 [P.O.:240; I.V.:1150; IV Piggyback:198.7] Out: -  Intake/Output this shift: No intake/output data recorded.  Lab Results: Recent Labs    03/01/24 1746 03/02/24 0524 03/03/24 0857  WBC 14.6* 15.0* 8.1  HGB 13.6 13.6 12.7*  HCT 40.3 40.0 37.4*  PLT 277 240 245   BMET Recent Labs    03/01/24 1746 03/02/24 0524 03/03/24 0857  NA 131* 133* 132*  K 3.2* 3.4* 3.1*  CL 98 101 100  CO2 20* 20* 20*  GLUCOSE 160* 165* 209*   BUN 36* 34* 42*  CREATININE 1.66* 1.74* 2.14*  CALCIUM 8.9 8.6* 8.5*   LFT Recent Labs    03/01/24 1746 03/02/24 0524 03/02/24 1224 03/03/24 0857  PROT 6.8 6.3*  --  6.3*  ALBUMIN 3.5 3.2*  --  3.0*  AST 73* 74*  --  160*  ALT 87* 95*  --  222*  ALKPHOS 55 54  --  75  BILITOT 1.1 1.6*  --  1.0  BILIDIR  --   --  0.3*  --    PT/INR Recent Labs    03/02/24 1517  LABPROT 16.4*  INR 1.3*   Hepatitis Panel Recent Labs    03/02/24 1517  HEPBSAG NON REACTIVE  HCVAB NON REACTIVE  HEPAIGM NON REACTIVE  HEPBIGM NON REACTIVE     Studies/Results: CT ABDOMEN PELVIS W CONTRAST Result Date: 03/01/2024 CLINICAL DATA:  Left lower quadrant pain, history of recent colonoscopy with polyp removal EXAM: CT ABDOMEN AND PELVIS WITH CONTRAST TECHNIQUE: Multidetector CT imaging of the abdomen and pelvis was performed using the standard protocol following bolus administration of intravenous contrast. RADIATION DOSE REDUCTION: This exam was performed according to the departmental dose-optimization program which includes automated exposure control, adjustment of the mA and/or kV according to patient size and/or use of iterative reconstruction technique. CONTRAST:  80mL OMNIPAQUE  IOHEXOL  300 MG/ML  SOLN COMPARISON:  08/05/2017 FINDINGS: Lower chest: 7 mm sub solid nodule is noted posteriorly in the right lower lobe. No other nodules are seen. No effusion is noted. Hepatobiliary: Fatty infiltration of the liver is noted.  The gallbladder is within normal limits. Pancreas: Unremarkable. No pancreatic ductal dilatation or surrounding inflammatory changes. Spleen: Normal in size without focal abnormality. Adrenals/Urinary Tract: Adrenal glands are within normal limits. Kidneys demonstrate a normal enhancement pattern bilaterally. Punctate nonobstructing right renal stone is seen. No obstructive changes are seen. The bladder is within normal limits. Stomach/Bowel: Mild diverticular changes noted in the colon.  The appendix is within normal limits. No small bowel or gastric abnormality is noted. Vascular/Lymphatic: Aortic atherosclerosis. No enlarged abdominal or pelvic lymph nodes. Reproductive: Prostate is unremarkable. Other: Bilateral fat containing inguinal hernias are noted stable from the prior exam. Previously seen umbilical hernia has been repaired. No free fluid is noted. Musculoskeletal: There are changes of lumbar spine are noted. No acute bony abnormality is seen. IMPRESSION: Punctate nonobstructing right renal stone. Diverticulosis without diverticulitis. Bilateral fat containing inguinal hernias. Right part-solid pulmonary nodule measuring 7 mm. Per Fleischner Society Guidelines, recommend a non-contrast Chest CT at 3-6 months to confirm persistence. If unchanged and the solid component remains < 6 mm, an annual non-contrast Chest CT should be performed for 5 years. If the solid component is 6-8 mm on follow-up, recommend biopsy or resection. If the solid component is > 8 mm on follow-up, recommend PET/CT, biopsy or resection. These guidelines do not apply to immunocompromised patients and patients with cancer. Follow up in patients with significant comorbidities as clinically warranted. For lung cancer screening, adhere to Lung-RADS guidelines. Reference: Radiology. 2017; 284(1):228-43. Electronically Signed   By: Oneil Devonshire M.D.   On: 03/01/2024 20:04    Assessment: 68 y.o. year old male  with history of arthritis, HTN, renal stones, DM, + Cologuard as outpatient and underwent colonoscopy 02/28/24 with multiple polyps and one large 30 mm carpet-like, multilobulated rectal polyp removed with mucosal resection and sent home on Augmentin  BID for 3 days prophylactically due to large resection. Presented to the ED 03/01/24 with acute onset of fever, chills, admitted with sepsis and started on IV antibiotics with GI consulted due to recent polypectomy and concern for evolving bacteremia.   Unable to rule  out a mild post-polypectomy syndrome type presentation but has denied ever having abdominal pain and CT was unrevealing.  Blood cultures without growth thus far.  WBC count normalized today. Considering elevated LFTs as per below and no source of prior fever identified, will stop Zosyn .   Elevated LFTs:  LFTs elevated this admission, previously normal as recent as July 2025.  On admission, AST 73, ALT 87.  Yesterday, AST 74, ALT 95, total bilirubin 1.6, but primarily indirect.  Today, AST 160, ALT 222, total bilirubin normalized.  Acute hepatitis panel negative.  Clinically, patient is doing well without signs of decompensation/acute liver failure.   Suspect this is multifactorial in the setting of acute illness, known hepatic steatosis, and concern for DILI in the setting of recent Augmentin , last dose 03/01/24, and now on Zosyn  which also carries concern for liver injury.  He had a  mild nonpruritic rash on the lower legs and abdomen following CT, but unclear if this was related to CT or possibly Augmentin  exposure.  He has also had some episodes of hypotension noted, typically occurring overnight/in the early morning hours.  Most recent BP remains soft at 102/67. Episodes of hypotension may also be contributing.  Otherwise, he denies alcohol, new medications other than Augmentin .  He has received some IV fluids this admission, but has been started on continuous IV fluids at 125 cc/h for 24 hours starting this  morning.   At this point, recommend stopping Zosyn , continuing supportive measures, avoid hypotensive episodes.  Hopeful that LFTs have reached their peak and will start to trend down tomorrow, but if this is DILI, it is possible LFTs will continue to rise and may take weeks to normalize. If LFTs continue to rise, will need to consider additional work-up.   AKI:  Present on admission, but worsening Cr today up to 2.14 from 1.66 on admission. Etiology unclear. Query whether this is related to  Augmentin  vs worsening secondary to CT contrast. He is receiving IV fluids per hospitalist and losartan  on hold.   Colon polyps:  Colonoscopy on 02/28/24: non-bleeding internal hemorrhoids, diverticulosis, two 4-5 m polyps in transverse, one 5 mm polyp in rectum, one 30 mm carpet-like and multilobulated polyp in rectum s/p removal with mucosal resection--pathology showed Tubular adenoma in the transverse colon and tubular adenoma with focal features suggestive of early traditional serrated adenoma in the rectum--negative for high-grade dysplasia or malignancy. Dr. Cindie had previously recommended repeat colonoscopy in 6 months.    Plan: Stop Zosyn .  Follow blood cultures Trend LFTs daily.  Continue supportive measures.  Next colonoscopy in 6 months.    LOS: 2 days    03/03/2024, 10:57 AM   Josette Centers, PA-C [facility]

## 2024-03-03 NOTE — Progress Notes (Addendum)
 PROGRESS NOTE  Anthony Frederick, is a 68 y.o. male, DOB - April 14, 1956, FMW:[id number]  Admit date - 03/01/2024   Admitting Physician Posey Maier, DO  Outpatient Primary MD for the patient is Shona Norleen PEDLAR, MD  LOS - 2  Chief Complaint  Patient presents with   Fever      Brief Narrative:   68 y.o. male with medical history significant of hypertension, type 2 diabetes mellitus, arthritis , nephrolithiasis admitted on 03/01/2024 with fevers (SIRS) after colonoscopy with polypectomy on 02/28/2024   -Assessment and Plan: 1)Fever of Unknown Origin---pt met SIRS criteria on admission-with fever, tachycardia, tachypnea and leukocytosis -Patient underwent underwent colonoscopy 02/28/24 with multiple polyps and one large 30 mm carpet-like, multilobulated rectal polyp removed with mucosal resection and sent home on Augmentin  BID for 3 days  prophylactically due to large resection. -No further fevers --Continues to deny abdominal pain -- Blood cultures from 03/01/2024 NGTD WBC 14.6 >>15.0>>8.1  - Total CK 93 -- Ok to dc  IV zosyn  after 03/03/24--if remains Afebrile and if culture remains Negative--  2)Colon Polyps---Colonoscopy on 02/28/24: non-bleeding internal hemorrhoids, diverticulosis, two 4-5 m polyps in transverse, one 5 mm polyp in rectum, one 30 mm carpet-like and multilobulated polyp in rectum s/p removal with mucosal resection--pathology showed Tubular adenoma in the transverse colon and tubular adenoma with focal features suggestive of early traditional serrated adenoma in the rectum--negative for high-grade dysplasia or malignancy -- GI team recommends repeat colonoscopy in 6 months  3)Transaminitis--??  Etiology - Denies EtOH use --Continues to deny abdominal pain -Acute Viral hepatitis profile is Neg -Patient had transient hypotension in the setting of sepsis -CT abdomen and pelvis on admission shows fatty infiltration of the liver -LFTs--are trending up -GI consult  appreciated  4)Abnormal Pulmonary Nodule --- CT showed right part-solid pulmonary nodule measuring 7 mm  -Repeat noncontrast CT chest in 3 to 6 months as outpatient advised  5)AKI----acute kidney injury    creatinine on admission= 1.66 ,  baseline creatinine = 1.0    ,  creatinine is now=2,14 - Patient had contrast exposure with CT on 03/02/2023--- --was dehydrated recently due to colonoscopy prep -Had transient hypotension --Hold losartan  --Continue IV fluids  renally adjust medications, avoid nephrotoxic agents / dehydration  / hypotension  6)HTN--hold losartan  due to AKI concerns - c/n  Coreg , continue Cardizem --- watch heart rate closely - Needs IV hydralazine  as needed elevated BP  7)DM2- Use Novolog/Humalog Sliding scale insulin with Accu-Cheks/Fingersticks as ordered   8)Class 2 Obesity--- abdominal imaging study with fatty liver -Low calorie diet, portion control and increase physical activity discussed with patient -Body mass index is 36.47 kg/m.    Status is: Inpatient   Disposition: The patient is from: Home              Anticipated d/c is to: Home              Anticipated d/c date is: 1 day              Patient currently is not medically stable to d/c. Barriers: Not Clinically Stable-   Code Status :  -  Code Status: Full Code   Family Communication:   (patient is alert, awake and coherent)   Discussed with his wife at bedside  DVT Prophylaxis  :   - SCDs   enoxaparin  (LOVENOX ) injection 40 mg Start: 03/02/24 1000 SCDs Start: 03/01/24 2339   Lab Results  Component Value Date   PLT 245 03/03/2024   Inpatient  Medications  Scheduled Meds:  [START ON 03/05/2024] diltiazem   360 mg Oral QHS   enoxaparin  (LOVENOX ) injection  40 mg Subcutaneous Q24H   sodium bicarbonate   1,300 mg Oral TID   Continuous Infusions:  sodium chloride  125 mL/hr at 03/03/24 1125   piperacillin -tazobactam (ZOSYN )  IV 3.375 g (03/03/24 1357)   PRN Meds:.hydrALAZINE , ondansetron   **OR** ondansetron  (ZOFRAN ) IV   Anti-infectives (From admission, onward)    Start     Dose/Rate Route Frequency Ordered Stop   03/01/24 2345  piperacillin -tazobactam (ZOSYN ) IVPB 3.375 g        3.375 g 12.5 mL/hr over 240 Minutes Intravenous Every 8 hours 03/01/24 2338 03/03/24 2359   03/01/24 1945  ceFEPIme  (MAXIPIME ) 2 g in sodium chloride  0.9 % 100 mL IVPB        2 g 200 mL/hr over 30 Minutes Intravenous  Once 03/01/24 1932 03/01/24 2100        Subjective: Morene Eagles today has no emesis,  No chest pain,    -Voiding well No further fevers -Continues to deny abdominal pain - Had BM x 3 over the last 24 hours, not watery - Wife at bedside, questions answered  Objective: Vitals:   03/02/24 1938 03/03/24 0207 03/03/24 0404 03/03/24 0845  BP: (!) 85/43 (!) 97/59 (!) 99/58 102/67  Pulse: 74 70 72   Resp: 18 20 20    Temp: 98.2 F (36.8 C) 98.5 F (36.9 C) 98.4 F (36.9 C)   TempSrc: Oral Oral Oral   SpO2: 95% 95% 93%   Weight:      Height:        Intake/Output Summary (Last 24 hours) at 03/03/2024 1524 Last data filed at 03/03/2024 0414 Gross per 24 hour  Intake 1588.72 ml  Output --  Net 1588.72 ml   Filed Weights   03/01/24 1741 03/01/24 2133  Weight: 116.1 kg 118.6 kg   Physical Exam Gen:- Awake Alert,  in no apparent distress  HEENT:- [place].AT, No sclera icterus Neck-Supple Neck,No JVD,.  Lungs-  CTAB , fair symmetrical air movement CV- S1, S2 normal, regular  Abd-  +ve B.Sounds, Abd Soft, No tenderness, increased adiposity Extremity/Skin:- No  edema, pedal pulses present , erythematous rash especially on lower extremities Psych-affect is appropriate, oriented x3 Neuro-no new focal deficits, no tremors  Data Reviewed: I have personally reviewed following labs and imaging studies  CBC: Recent Labs  Lab 03/01/24 1746 03/02/24 0524 03/03/24 0857  WBC 14.6* 15.0* 8.1  NEUTROABS 12.9*  --   --   HGB 13.6 13.6 12.7*  HCT 40.3 40.0 37.4*  MCV 87.0  87.1 87.6  PLT 277 240 245   Basic Metabolic Panel: Recent Labs  Lab 03/01/24 1746 03/02/24 0524 03/03/24 0857  NA 131* 133* 132*  K 3.2* 3.4* 3.1*  CL 98 101 100  CO2 20* 20* 20*  GLUCOSE 160* 165* 209*  BUN 36* 34* 42*  CREATININE 1.66* 1.74* 2.14*  CALCIUM 8.9 8.6* 8.5*  MG  --  1.7  --   PHOS  --  1.9*  --    GFR: Estimated Creatinine Clearance: 43.9 mL/min (A) (by C-G formula based on SCr of 2.14 mg/dL (H)). Liver Function Tests: Recent Labs  Lab 03/01/24 1746 03/02/24 0524 03/03/24 0857  AST 73* 74* 160*  ALT 87* 95* 222*  ALKPHOS 55 54 75  BILITOT 1.1 1.6* 1.0  PROT 6.8 6.3* 6.3*  ALBUMIN 3.5 3.2* 3.0*   Cardiac Enzymes: Recent Labs  Lab 03/02/24 1523  CKTOTAL 93   Recent Results (from the past 240 hours)  Resp panel by RT-PCR (RSV, Flu A&B, Covid) Anterior Nasal Swab     Status: None   Collection Time: 03/01/24  5:46 PM   Specimen: Anterior Nasal Swab  Result Value Ref Range Status   SARS Coronavirus 2 by RT PCR NEGATIVE NEGATIVE Final    Comment: (NOTE) SARS-CoV-2 target nucleic acids are NOT DETECTED.  The SARS-CoV-2 RNA is generally detectable in upper respiratory specimens during the acute phase of infection. The lowest concentration of SARS-CoV-2 viral copies this assay can detect is 138 copies/mL. A negative result does not preclude SARS-Cov-2 infection and should not be used as the sole basis for treatment or other patient management decisions. A negative result may occur with  improper specimen collection/handling, submission of specimen other than nasopharyngeal swab, presence of viral mutation(s) within the areas targeted by this assay, and inadequate number of viral copies(<138 copies/mL). A negative result must be combined with clinical observations, patient history, and epidemiological information. The expected result is Negative.  Fact Sheet for Patients:  BloggerCourse.com  Fact Sheet for Healthcare  Providers:  SeriousBroker.it  This test is no t yet approved or cleared by the [place]  FDA and  has been authorized for detection and/or diagnosis of SARS-CoV-2 by FDA under an Emergency Use Authorization (EUA). This EUA will remain  in effect (meaning this test can be used) for the duration of the COVID-19 declaration under Section 564(b)(1) of the Act, 21 U.S.C.section 360bbb-3(b)(1), unless the authorization is terminated  or revoked sooner.       Influenza A by PCR NEGATIVE NEGATIVE Final   Influenza B by PCR NEGATIVE NEGATIVE Final    Comment: (NOTE) The Xpert Xpress SARS-CoV-2/FLU/RSV plus assay is intended as an aid in the diagnosis of influenza from Nasopharyngeal swab specimens and should not be used as a sole basis for treatment. Nasal washings and aspirates are unacceptable for Xpert Xpress SARS-CoV-2/FLU/RSV testing.  Fact Sheet for Patients: BloggerCourse.com  Fact Sheet for Healthcare Providers: SeriousBroker.it  This test is not yet approved or cleared by the [place]  FDA and has been authorized for detection and/or diagnosis of SARS-CoV-2 by FDA under an Emergency Use Authorization (EUA). This EUA will remain in effect (meaning this test can be used) for the duration of the COVID-19 declaration under Section 564(b)(1) of the Act, 21 U.S.C. section 360bbb-3(b)(1), unless the authorization is terminated or revoked.     Resp Syncytial Virus by PCR NEGATIVE NEGATIVE Final    Comment: (NOTE) Fact Sheet for Patients: BloggerCourse.com  Fact Sheet for Healthcare Providers: SeriousBroker.it  This test is not yet approved or cleared by the [place]  FDA and has been authorized for detection and/or diagnosis of SARS-CoV-2 by FDA under an Emergency Use Authorization (EUA). This EUA will remain in effect (meaning this test can be  used) for the duration of the COVID-19 declaration under Section 564(b)(1) of the Act, 21 U.S.C. section 360bbb-3(b)(1), unless the authorization is terminated or revoked.  Performed at [facility], [address] [place], [place] [address]   Blood culture (routine x 2)     Status: None (Preliminary result)   Collection Time: 03/01/24  5:48 PM   Specimen: BLOOD RIGHT WRIST  Result Value Ref Range Status   Specimen Description   Final    BLOOD RIGHT WRIST BOTTLES DRAWN AEROBIC AND ANAEROBIC   Special Requests Blood Culture adequate volume  Final   Culture   Final  NO GROWTH < 24 HOURS Performed at [facility], [address] [place], [place] [address]    Report Status PENDING  Incomplete  Blood culture (routine x 2)     Status: None (Preliminary result)   Collection Time: 03/01/24  5:53 PM   Specimen: BLOOD LEFT FOREARM  Result Value Ref Range Status   Specimen Description   Final    BLOOD LEFT FOREARM BOTTLES DRAWN AEROBIC AND ANAEROBIC   Special Requests Blood Culture adequate volume  Final   Culture   Final    NO GROWTH < 24 HOURS Performed at [facility], [address] [place], [place] [address]    Report Status PENDING  Incomplete    Radiology Studies: CT ABDOMEN PELVIS W CONTRAST Result Date: 03/01/2024 CLINICAL DATA:  Left lower quadrant pain, history of recent colonoscopy with polyp removal EXAM: CT ABDOMEN AND PELVIS WITH CONTRAST TECHNIQUE: Multidetector CT imaging of the abdomen and pelvis was performed using the standard protocol following bolus administration of intravenous contrast. RADIATION DOSE REDUCTION: This exam was performed according to the departmental dose-optimization program which includes automated exposure control, adjustment of the mA and/or kV according to patient size and/or use of iterative reconstruction technique. CONTRAST:  80mL OMNIPAQUE  IOHEXOL  300 MG/ML  SOLN COMPARISON:  08/05/2017 FINDINGS: Lower chest: 7 mm sub solid nodule is noted  posteriorly in the right lower lobe. No other nodules are seen. No effusion is noted. Hepatobiliary: Fatty infiltration of the liver is noted. The gallbladder is within normal limits. Pancreas: Unremarkable. No pancreatic ductal dilatation or surrounding inflammatory changes. Spleen: Normal in size without focal abnormality. Adrenals/Urinary Tract: Adrenal glands are within normal limits. Kidneys demonstrate a normal enhancement pattern bilaterally. Punctate nonobstructing right renal stone is seen. No obstructive changes are seen. The bladder is within normal limits. Stomach/Bowel: Mild diverticular changes noted in the colon. The appendix is within normal limits. No small bowel or gastric abnormality is noted. Vascular/Lymphatic: Aortic atherosclerosis. No enlarged abdominal or pelvic lymph nodes. Reproductive: Prostate is unremarkable. Other: Bilateral fat containing inguinal hernias are noted stable from the prior exam. Previously seen umbilical hernia has been repaired. No free fluid is noted. Musculoskeletal: There are changes of lumbar spine are noted. No acute bony abnormality is seen. IMPRESSION: Punctate nonobstructing right renal stone. Diverticulosis without diverticulitis. Bilateral fat containing inguinal hernias. Right part-solid pulmonary nodule measuring 7 mm. Per Fleischner Society Guidelines, recommend a non-contrast Chest CT at 3-6 months to confirm persistence. If unchanged and the solid component remains < 6 mm, an annual non-contrast Chest CT should be performed for 5 years. If the solid component is 6-8 mm on follow-up, recommend biopsy or resection. If the solid component is > 8 mm on follow-up, recommend PET/CT, biopsy or resection. These guidelines do not apply to immunocompromised patients and patients with cancer. Follow up in patients with significant comorbidities as clinically warranted. For lung cancer screening, adhere to Lung-RADS guidelines. Reference: Radiology. 2017;  284(1):228-43. Electronically Signed   By: Oneil Devonshire M.D.   On: 03/01/2024 20:04   Scheduled Meds:  [START ON 03/05/2024] diltiazem   360 mg Oral QHS   enoxaparin  (LOVENOX ) injection  40 mg Subcutaneous Q24H   sodium bicarbonate   1,300 mg Oral TID   Continuous Infusions:  sodium chloride  125 mL/hr at 03/03/24 1125   piperacillin -tazobactam (ZOSYN )  IV 3.375 g (03/03/24 1357)    LOS: 2 days   Rendall Carwin M.D on 03/03/2024 at 3:24 PM  Go to [email or web address] - for contact info  [facility] - Office  [phone]  If 7PM-7AM, please contact night-coverage [email or web address] 03/03/2024, 3:24 PM

## 2024-03-04 DIAGNOSIS — R7881 Bacteremia: Secondary | ICD-10-CM | POA: Diagnosis not present

## 2024-03-04 DIAGNOSIS — I10 Essential (primary) hypertension: Secondary | ICD-10-CM | POA: Diagnosis not present

## 2024-03-04 DIAGNOSIS — E1165 Type 2 diabetes mellitus with hyperglycemia: Secondary | ICD-10-CM | POA: Diagnosis not present

## 2024-03-04 DIAGNOSIS — N179 Acute kidney failure, unspecified: Secondary | ICD-10-CM | POA: Diagnosis not present

## 2024-03-04 DIAGNOSIS — R6510 Systemic inflammatory response syndrome (SIRS) of non-infectious origin without acute organ dysfunction: Secondary | ICD-10-CM

## 2024-03-04 LAB — COMPREHENSIVE METABOLIC PANEL WITH GFR
ALT: 235 U/L — ABNORMAL HIGH (ref 0–44)
AST: 132 U/L — ABNORMAL HIGH (ref 15–41)
Albumin: 3 g/dL — ABNORMAL LOW (ref 3.5–5.0)
Alkaline Phosphatase: 93 U/L (ref 38–126)
Anion gap: 9 (ref 5–15)
BUN: 30 mg/dL — ABNORMAL HIGH (ref 8–23)
CO2: 23 mmol/L (ref 22–32)
Calcium: 8.5 mg/dL — ABNORMAL LOW (ref 8.9–10.3)
Chloride: 105 mmol/L (ref 98–111)
Creatinine, Ser: 1.58 mg/dL — ABNORMAL HIGH (ref 0.61–1.24)
GFR, Estimated: 48 mL/min — ABNORMAL LOW (ref >60)
Glucose, Bld: 130 mg/dL — ABNORMAL HIGH (ref 70–99)
Potassium: 3.7 mmol/L (ref 3.5–5.1)
Sodium: 137 mmol/L (ref 135–145)
Total Bilirubin: 0.7 mg/dL (ref 0.0–1.2)
Total Protein: 6 g/dL — ABNORMAL LOW (ref 6.5–8.1)

## 2024-03-04 LAB — CBC
HCT: 36.1 % — ABNORMAL LOW (ref 39.0–52.0)
Hemoglobin: 12 g/dL — ABNORMAL LOW (ref 13.0–17.0)
MCH: 29.3 pg (ref 26.0–34.0)
MCHC: 33.2 g/dL (ref 30.0–36.0)
MCV: 88.3 fL (ref 80.0–100.0)
Platelets: 266 K/uL (ref 150–400)
RBC: 4.09 MIL/uL — ABNORMAL LOW (ref 4.22–5.81)
RDW: 13 % (ref 11.5–15.5)
WBC: 6.7 K/uL (ref 4.0–10.5)
nRBC: 0 % (ref 0.0–0.2)

## 2024-03-04 MED ORDER — LOSARTAN POTASSIUM-HCTZ 100-25 MG PO TABS: 1.0000 | ORAL_TABLET | Freq: Every day | ORAL | Status: AC

## 2024-03-04 MED ORDER — ACETAMINOPHEN 500 MG PO TABS: 500.0000 mg | ORAL_TABLET | Freq: Three times a day (TID) | ORAL | Status: AC | PRN

## 2024-03-04 NOTE — Progress Notes (Signed)
 Mobility Specialist Progress Note:    03/04/24 1000  Mobility  Activity Ambulated with assistance  Level of Assistance Independent  Assistive Device None  Distance Ambulated (ft) 500 ft  Range of Motion/Exercises Active;All extremities  Activity Response Tolerated well  Mobility Referral Yes  Mobility visit 1 Mobility  Mobility Specialist Start Time (ACUTE ONLY) 1000  Mobility Specialist Stop Time (ACUTE ONLY) 1020  Mobility Specialist Time Calculation (min) (ACUTE ONLY) 20 min   Pt received sitting EOB, wife and daughter in room. Agreeable to mobility, independently able to stand and ambulate with no AD. Tolerated well,asx throughout. Returned sitting EOB, all needs met.  Madisan Bice Mobility Specialist Please contact via Special educational needs teacher or  Rehab office at [phone]

## 2024-03-04 NOTE — Discharge Summary (Signed)
 Physician Discharge Summary   Patient: Anthony Frederick MRN: [id number] DOB: 19-Aug-1955  Admit date:     03/01/2024  Discharge date: 03/04/24  Discharge Physician: Eric Nunnery   PCP: Shona Norleen PEDLAR, MD   Recommendations at discharge:  Repeat complete metabolic panel to follow electrolytes, renal function and LFTs Reassess blood pressure and adjust medication as needed Continue assisting patient with weight loss management Outpatient follow-up of patient's CBG fluctuation/A1c with further adjustment to hypoglycemic regimen as needed. Make sure patient follow-up with gastroenterology service as instructed. Please repeat CT chest in 3-74-month to follow-up pulmonary nodule  Discharge Diagnoses: Hyponatremia Hypokalemia AKI (acute kidney injury) (HCC) Dehydration Transaminitis Pulmonary nodule Essential hypertension Type 2 diabetes mellitus with hyperglycemia (HCC) Obesity, Class II, BMI 35-39.9 Fever SIRS (systemic inflammatory response syndrome) [facility])   Hospital Course: No notes on file  Assessment and Plan: 1)Fever of Unknown Origin/SIRS  -pt met SIRS criteria on admission-with fever, tachycardia, tachypnea and leukocytosis. -No source of infection identified - Patient's cultures remains without any growth - There was no signs of infection isolated and after discussing with gastroenterology service decision made not pursued any further antibiotic coverage. - Patient remains hemodynamically stable - WBCs normalized and the patient was discharged home in stable condition.   2)Colon Polyps---Colonoscopy on 02/28/24: non-bleeding internal hemorrhoids, diverticulosis, two 4-5 m polyps in transverse, one 5 mm polyp in rectum, one 30 mm carpet-like and multilobulated polyp in rectum s/p removal with mucosal resection--pathology showed Tubular adenoma in the transverse colon and tubular adenoma with focal features suggestive of early traditional serrated adenoma in the  rectum--negative for high-grade dysplasia or malignancy -- GI team recommends repeat colonoscopy in 6 months -GI service will arrange outpatient follow-up.   3)Transaminitis--??  Etiology - Denies EtOH use -Acute Viral hepatitis profile is Neg -Patient had transient hypotension in the setting of sepsis -CT abdomen and pelvis on admission shows fatty infiltration of the liver without any other intra-abdominal abnormalities. -LFTs--trending down at time of discharge; possible in the setting of shock liver with transient hypotension and dehydration; versus hepatic abscesses from antibiotics. - Continue to follow with GI service as an outpatient - Repeat LFTs in 10 days.   4)Abnormal Pulmonary Nodule --- CT showed right part-solid pulmonary nodule measuring 7 mm  -Repeat noncontrast CT chest in 3 to 6 months as outpatient advised   5)AKI----acute kidney injury    creatinine on admission= 1.66 ,  baseline creatinine = around 1.0    ,  creatinine peaked at 2.14 - Patient had contrast exposure with CT on 03/02/2023--- --was dehydrated recently due to colonoscopy prep -Had transient hypotension --will continue to Hold losartan  and hydrochlorothiazide -after fluid resuscitation patient renal function adequately improving and felt to be safe at time of discharge - Repeat basic metabolic panel follow-up visit - Continue minimizing nephrotoxic agent - Patient advised to maintain adequate hydration.   6)HTN- -blood pressure stable at discharge - Continue treatment with Cardizem  and and the use of Coreg  - Continue holding losartan  and HCTZ - Patient advised to follow heart healthy/low-sodium diet.   7)DM2- -resume home hypoglycemic regimen - Patient advised to maintain adequate hydration and to follow-up modified carbohydrate diet.   8)Class 2 Obesity--- abdominal imaging study with fatty liver -Low calorie diet, portion control and increase physical activity discussed with patient -Patient  Body mass index is 36.47 kg/m.   Consultants: Gastroenterology service Procedures performed: See below for x-ray report. Disposition: Home Diet recommendation: Heart healthy/modified carbohydrate and low calorie diet.  DISCHARGE MEDICATION: Allergies as of 03/04/2024       Reactions   Levaquin [levofloxacin] Rash        Medication List     STOP taking these medications    amoxicillin -clavulanate 875-125 MG tablet Commonly known as: AUGMENTIN        TAKE these medications    acetaminophen  500 MG tablet Commonly known as: TYLENOL  Take 1 tablet (500 mg total) by mouth every 8 (eight) hours as needed for mild pain (pain score 1-3) or headache. What changed: when to take this   diltiazem  360 MG 24 hr capsule Commonly known as: CARDIZEM  CD Take 360 mg by mouth at bedtime.   Januvia 100 MG tablet Generic drug: sitaGLIPtin Take 100 mg by mouth daily.   losartan -hydrochlorothiazide 100-25 MG tablet Commonly known as: HYZAAR Take 1 tablet by mouth daily. Start taking on: March 09, 2024 What changed: These instructions start on March 09, 2024. If you are unsure what to do until then, ask your doctor or other care provider.   metFORMIN 1000 MG tablet Commonly known as: GLUCOPHAGE Take 1,000 mg by mouth 2 (two) times daily.        Follow-up Information     Shona Norleen PEDLAR, MD. Schedule an appointment as soon as possible for a visit in 10 day(s).   Specialty: Internal Medicine Contact information: [address] [place] [address] [phone]                Discharge Exam: Filed Weights   03/01/24 1741 03/01/24 2133  Weight: 116.1 kg 118.6 kg   General exam: Alert, awake, oriented x 3 Respiratory system: Clear to auscultation. Respiratory effort normal. Cardiovascular system:RRR. No murmurs, rubs, gallops. Gastrointestinal system: Abdomen is obese, nondistended, soft and nontender. No organomegaly or masses felt. Normal bowel sounds  heard. Central nervous system:  No focal neurological deficits. Extremities: No C/C/E, +pedal pulses Skin: No rashes, lesions or ulcers Psychiatry: Judgement and insight appear normal. Mood & affect appropriate.    Condition at discharge: Stable and improved.  The results of significant diagnostics from this hospitalization (including imaging, microbiology, ancillary and laboratory) are listed below for reference.   Imaging Studies: CT ABDOMEN PELVIS W CONTRAST Result Date: 03/01/2024 CLINICAL DATA:  Left lower quadrant pain, history of recent colonoscopy with polyp removal EXAM: CT ABDOMEN AND PELVIS WITH CONTRAST TECHNIQUE: Multidetector CT imaging of the abdomen and pelvis was performed using the standard protocol following bolus administration of intravenous contrast. RADIATION DOSE REDUCTION: This exam was performed according to the departmental dose-optimization program which includes automated exposure control, adjustment of the mA and/or kV according to patient size and/or use of iterative reconstruction technique. CONTRAST:  80mL OMNIPAQUE  IOHEXOL  300 MG/ML  SOLN COMPARISON:  08/05/2017 FINDINGS: Lower chest: 7 mm sub solid nodule is noted posteriorly in the right lower lobe. No other nodules are seen. No effusion is noted. Hepatobiliary: Fatty infiltration of the liver is noted. The gallbladder is within normal limits. Pancreas: Unremarkable. No pancreatic ductal dilatation or surrounding inflammatory changes. Spleen: Normal in size without focal abnormality. Adrenals/Urinary Tract: Adrenal glands are within normal limits. Kidneys demonstrate a normal enhancement pattern bilaterally. Punctate nonobstructing right renal stone is seen. No obstructive changes are seen. The bladder is within normal limits. Stomach/Bowel: Mild diverticular changes noted in the colon. The appendix is within normal limits. No small bowel or gastric abnormality is noted. Vascular/Lymphatic: Aortic atherosclerosis. No  enlarged abdominal or pelvic lymph nodes. Reproductive: Prostate is unremarkable. Other: Bilateral  fat containing inguinal hernias are noted stable from the prior exam. Previously seen umbilical hernia has been repaired. No free fluid is noted. Musculoskeletal: There are changes of lumbar spine are noted. No acute bony abnormality is seen. IMPRESSION: Punctate nonobstructing right renal stone. Diverticulosis without diverticulitis. Bilateral fat containing inguinal hernias. Right part-solid pulmonary nodule measuring 7 mm. Per Fleischner Society Guidelines, recommend a non-contrast Chest CT at 3-6 months to confirm persistence. If unchanged and the solid component remains < 6 mm, an annual non-contrast Chest CT should be performed for 5 years. If the solid component is 6-8 mm on follow-up, recommend biopsy or resection. If the solid component is > 8 mm on follow-up, recommend PET/CT, biopsy or resection. These guidelines do not apply to immunocompromised patients and patients with cancer. Follow up in patients with significant comorbidities as clinically warranted. For lung cancer screening, adhere to Lung-RADS guidelines. Reference: Radiology. 2017; 284(1):228-43. Electronically Signed   By: Oneil Devonshire M.D.   On: 03/01/2024 20:04    Microbiology: Results for orders placed or performed during the hospital encounter of 03/01/24  Resp panel by RT-PCR (RSV, Flu A&B, Covid) Anterior Nasal Swab     Status: None   Collection Time: 03/01/24  5:46 PM   Specimen: Anterior Nasal Swab  Result Value Ref Range Status   SARS Coronavirus 2 by RT PCR NEGATIVE NEGATIVE Final    Comment: (NOTE) SARS-CoV-2 target nucleic acids are NOT DETECTED.  The SARS-CoV-2 RNA is generally detectable in upper respiratory specimens during the acute phase of infection. The lowest concentration of SARS-CoV-2 viral copies this assay can detect is 138 copies/mL. A negative result does not preclude SARS-Cov-2 infection and should not  be used as the sole basis for treatment or other patient management decisions. A negative result may occur with  improper specimen collection/handling, submission of specimen other than nasopharyngeal swab, presence of viral mutation(s) within the areas targeted by this assay, and inadequate number of viral copies(<138 copies/mL). A negative result must be combined with clinical observations, patient history, and epidemiological information. The expected result is Negative.  Fact Sheet for Patients:  BloggerCourse.com  Fact Sheet for Healthcare Providers:  SeriousBroker.it  This test is no t yet approved or cleared by the [place]  FDA and  has been authorized for detection and/or diagnosis of SARS-CoV-2 by FDA under an Emergency Use Authorization (EUA). This EUA will remain  in effect (meaning this test can be used) for the duration of the COVID-19 declaration under Section 564(b)(1) of the Act, 21 U.S.C.section 360bbb-3(b)(1), unless the authorization is terminated  or revoked sooner.       Influenza A by PCR NEGATIVE NEGATIVE Final   Influenza B by PCR NEGATIVE NEGATIVE Final    Comment: (NOTE) The Xpert Xpress SARS-CoV-2/FLU/RSV plus assay is intended as an aid in the diagnosis of influenza from Nasopharyngeal swab specimens and should not be used as a sole basis for treatment. Nasal washings and aspirates are unacceptable for Xpert Xpress SARS-CoV-2/FLU/RSV testing.  Fact Sheet for Patients: BloggerCourse.com  Fact Sheet for Healthcare Providers: SeriousBroker.it  This test is not yet approved or cleared by the [place]  FDA and has been authorized for detection and/or diagnosis of SARS-CoV-2 by FDA under an Emergency Use Authorization (EUA). This EUA will remain in effect (meaning this test can be used) for the duration of the COVID-19 declaration under Section  564(b)(1) of the Act, 21 U.S.C. section 360bbb-3(b)(1), unless the authorization is terminated or revoked.  Resp Syncytial Virus by PCR NEGATIVE NEGATIVE Final    Comment: (NOTE) Fact Sheet for Patients: BloggerCourse.com  Fact Sheet for Healthcare Providers: SeriousBroker.it  This test is not yet approved or cleared by the [place]  FDA and has been authorized for detection and/or diagnosis of SARS-CoV-2 by FDA under an Emergency Use Authorization (EUA). This EUA will remain in effect (meaning this test can be used) for the duration of the COVID-19 declaration under Section 564(b)(1) of the Act, 21 U.S.C. section 360bbb-3(b)(1), unless the authorization is terminated or revoked.  Performed at [facility], [address] [place], [place] [address]   Blood culture (routine x 2)     Status: None (Preliminary result)   Collection Time: 03/01/24  5:48 PM   Specimen: BLOOD RIGHT WRIST  Result Value Ref Range Status   Specimen Description   Final    BLOOD RIGHT WRIST BOTTLES DRAWN AEROBIC AND ANAEROBIC   Special Requests Blood Culture adequate volume  Final   Culture   Final    NO GROWTH 3 DAYS Performed at [facility], [address] [place], [place] [address]    Report Status PENDING  Incomplete  Blood culture (routine x 2)     Status: None (Preliminary result)   Collection Time: 03/01/24  5:53 PM   Specimen: BLOOD LEFT FOREARM  Result Value Ref Range Status   Specimen Description   Final    BLOOD LEFT FOREARM BOTTLES DRAWN AEROBIC AND ANAEROBIC   Special Requests Blood Culture adequate volume  Final   Culture   Final    NO GROWTH 3 DAYS Performed at [facility], [address] [place], [place] [address]    Report Status PENDING  Incomplete    Labs: CBC: Recent Labs  Lab 03/01/24 1746 03/02/24 0524 03/03/24 0857 03/04/24 0440  WBC 14.6* 15.0* 8.1 6.7  NEUTROABS 12.9*  --   --   --   HGB 13.6 13.6 12.7*  12.0*  HCT 40.3 40.0 37.4* 36.1*  MCV 87.0 87.1 87.6 88.3  PLT 277 240 245 266   Basic Metabolic Panel: Recent Labs  Lab 03/01/24 1746 03/02/24 0524 03/03/24 0857 03/04/24 0440  NA 131* 133* 132* 137  K 3.2* 3.4* 3.1* 3.7  CL 98 101 100 105  CO2 20* 20* 20* 23  GLUCOSE 160* 165* 209* 130*  BUN 36* 34* 42* 30*  CREATININE 1.66* 1.74* 2.14* 1.58*  CALCIUM 8.9 8.6* 8.5* 8.5*  MG  --  1.7  --   --   PHOS  --  1.9*  --   --    Liver Function Tests: Recent Labs  Lab 03/01/24 1746 03/02/24 0524 03/03/24 0857 03/04/24 0440  AST 73* 74* 160* 132*  ALT 87* 95* 222* 235*  ALKPHOS 55 54 75 93  BILITOT 1.1 1.6* 1.0 0.7  PROT 6.8 6.3* 6.3* 6.0*  ALBUMIN 3.5 3.2* 3.0* 3.0*   CBG: Recent Labs  Lab 02/28/24 1008  GLUCAP 111*    Discharge time spent:  35 minutes.  Signed: Eric Nunnery, MD [facility] 03/04/2024

## 2024-03-04 NOTE — Care Management Important Message (Signed)
 Important Message  Patient Details  Name: Anthony Frederick MRN: [id number] Date of Birth: 1956-04-06   Important Message Given:  N/A - LOS <3 / Initial given by admissions     [name] 03/04/2024, 2:07 PM

## 2024-03-04 NOTE — Progress Notes (Signed)
 Subjective: Feeling good this morning, patient has no GI complaints. He is ready to go home.  Objective: Vital signs in last 24 hours: Temp:  [98.4 F (36.9 C)-98.8 F (37.1 C)] 98.8 F (37.1 C) (08/26 2012) Pulse Rate:  [84-91] 91 (08/26 2012) Resp:  [18-20] 20 (08/26 2012) BP: (135-136)/(75-76) 135/75 (08/26 2012) SpO2:  [100 %] 100 % (08/26 2012) Last BM Date : 03/01/24 General:   Alert and oriented, pleasant Head:  Normocephalic and atraumatic. Eyes:  No icterus, sclera clear. Conjuctiva pink.  Mouth:  Without lesions, mucosa pink and moist.  Heart:  S1, S2 present, no murmurs noted.  Lungs: Clear to auscultation bilaterally, without wheezing, rales, or rhonchi.  Abdomen:  Bowel sounds present, soft, non-tender, non-distended. No HSM or hernias noted. No rebound or guarding. No masses appreciated  Msk:  Symmetrical without gross deformities. Normal posture. Extremities:  Without clubbing or edema. Neurologic:  Alert and  oriented x4;  grossly normal neurologically. Skin:  Warm and dry, intact without significant lesions.  Psych:  Alert and cooperative. Normal mood and affect.  Intake/Output from previous day: 08/26 0701 - 08/27 0700 In: 675 [I.V.:675] Out: -  Lab Results: Recent Labs    03/02/24 0524 03/03/24 0857 03/04/24 0440  WBC 15.0* 8.1 6.7  HGB 13.6 12.7* 12.0*  HCT 40.0 37.4* 36.1*  PLT 240 245 266   BMET Recent Labs    03/02/24 0524 03/03/24 0857 03/04/24 0440  NA 133* 132* 137  K 3.4* 3.1* 3.7  CL 101 100 105  CO2 20* 20* 23  GLUCOSE 165* 209* 130*  BUN 34* 42* 30*  CREATININE 1.74* 2.14* 1.58*  CALCIUM 8.6* 8.5* 8.5*   LFT Recent Labs    03/02/24 0524 03/02/24 1224 03/03/24 0857 03/04/24 0440  PROT 6.3*  --  6.3* 6.0*  ALBUMIN 3.2*  --  3.0* 3.0*  AST 74*  --  160* 132*  ALT 95*  --  222* 235*  ALKPHOS 54  --  75 93  BILITOT 1.6*  --  1.0 0.7  BILIDIR  --  0.3*  --   --    PT/INR Recent Labs    03/02/24 1517  LABPROT 16.4*   INR 1.3*   Hepatitis Panel Recent Labs    03/02/24 1517  HEPBSAG NON REACTIVE  HCVAB NON REACTIVE  HEPAIGM NON REACTIVE  HEPBIGM NON REACTIVE    Assessment: 68 year old male  with history of arthritis, HTN, renal stones, DM, + Cologuard as outpatient and underwent colonoscopy 02/28/24 with multiple polyps and one large 30 mm carpet-like, multilobulated rectal polyp removed with mucosal resection and sent home on Augmentin  BID for 3 days prophylactically due to large resection. Presented to the ED 03/01/24 with acute onset of fever, chills, admitted with sepsis and started on IV antibiotics with GI consulted due to recent polypectomy and concern for evolving bacteremia   Elevated LFTs; -On admission, AST 73, ALT 87.  - -peaked yesterday with AST 160, ALT 222, total bilirubin normalized.   -Acute hepatitis panel negative.  - Clinically, doing well without signs of decompensation/acute liver failure.  -likely multifactorial in setting of acute illness, hepatic steatosis, some concern for DILI in setting of augmentin  and zosyn , some mild hypotension -LFTs improving today  Fever/chills, post polypectomy:  -etiology unclear, could be mild post polypectomy syndrome -no abdominal pain -CT unrevealing -blood cultures negative -Lactic acid WNL -WBC normalized  - no source of fever identified but this has resolved  Colon polyps: -Colonoscopy  02/28/24: non-bleeding internal hemorrhoids, diverticulosis, two 4-5 m polyps in transverse, one 5 mm polyp in rectum, one 30 mm carpet-like and multilobulated polyp in rectum s/p removal with mucosal resection  -path: TA in transverse colon and TA with focal features suggestive of early traditional serrated adenoma in the rectum, negative for high grade dysplasia or malignancy -recommended repeat TCS in 6 months  Plan: -Continue supportive measures -Trend LFTs -avoid liver offending meds/alcohol for now  Dr. Ricky discussed case with me, will  likely discharge patient today as he remains stable. Recommend GI outpatient follow up in 2-3 weeks, repeat LFTs in 7-10 days    LOS: 3 days    03/04/2024, 11:19 AM   Kae Lauman L. Alder Murri, MSN, APRN, AGNP-C Adult-Gerontology Nurse Practitioner [facility]

## 2024-03-05 ENCOUNTER — Telehealth: Payer: Self-pay

## 2024-03-05 NOTE — Transitions of Care (Post Inpatient/ED Visit) (Signed)
   03/05/2024  Name: Anthony Frederick MRN: [id number] DOB: 1955-11-12  Today's TOC FU Call Status: Today's TOC FU Call Status:: Successful TOC FU Call Completed TOC FU Call Complete Date: 03/04/24 Patient's Name and Date of Birth confirmed.  Transition Care Management Follow-up Telephone Call [facility]charge [facility] Type of Discharge: Inpatient Admission Primary Inpatient Discharge Diagnosis:: Bacteremia How have you been since you were released from the hospital?: Better Any questions or concerns?: No  Items Reviewed: Did you receive and understand the discharge instructions provided?: Yes Medications obtained,verified, and reconciled?: Yes (Medications Reviewed) Any new allergies since your discharge?: No Dietary orders reviewed?: No Do you have support at home?: Yes People in Home [RPT]: spouse Name of Support/Comfort Primary Source: [name], Anthony Frederick  Medications Reviewed Today: Medications Reviewed Today     Reviewed by Eilleen Richerd GRADE, RN (Registered Nurse) on 03/05/24 at 1405  Med List Status: <None>   Medication Order Taking? Sig Documenting Provider Last Dose Status Informant  acetaminophen  (TYLENOL ) 500 MG tablet [id number] Yes Take 1 tablet (500 mg total) by mouth every 8 (eight) hours as needed for mild pain (pain score 1-3) or headache. Ricky Fines, MD  Active   diltiazem  (CARDIZEM  CD) 360 MG 24 hr capsule [id number] Yes Take 360 mg by mouth at bedtime. [provider]  Active Spouse/Significant Other, Self, Pharmacy Records  JANUVIA 100 MG tablet [id number] Yes Take 100 mg by mouth daily. [provider]  Active Spouse/Significant Other, Self, Pharmacy Records  losartan -hydrochlorothiazide (HYZAAR) 100-25 MG tablet [id number]  Take 1 tablet by mouth daily. Ricky Fines, MD  Active   metFORMIN (GLUCOPHAGE) 1000 MG tablet [id number] Yes Take 1,000 mg by mouth 2 (two) times daily. [provider]  Active Spouse/Significant Other,  Self, Pharmacy Records            Home Care and Equipment/Supplies: Were Home Health Services Ordered?: No Any new equipment or medical supplies ordered?: No  Functional Questionnaire: Do you need assistance with bathing/showering or dressing?: No Do you need assistance with meal preparation?: No Do you need assistance with eating?: No Do you have difficulty maintaining continence: No Do you need assistance with getting out of bed/getting out of a chair/moving?: No Do you have difficulty managing or taking your medications?: No  Follow up appointments reviewed: PCP Follow-up appointment confirmed?: Yes Date of PCP follow-up appointment?: 03/13/24 Follow-up Provider: Dr. Shona Driscilla Salvage Follow-up appointment confirmed?: NA Do you need transportation to your follow-up appointment?: No Do you understand care options if your condition(s) worsen?: Yes-patient verbalized understanding  SDOH Interventions Today    Flowsheet Row Most Recent Value  SDOH Interventions   Food Insecurity Interventions Intervention Not Indicated  Housing Interventions Intervention Not Indicated  Transportation Interventions Intervention Not Indicated  Utilities Interventions Intervention Not Indicated    Declines 30 day TOC program at this time  Richerd Eilleen, Charity fundraiser, Scientist, research (physical sciences), CCM [employer], [facility] RN Care Manager Direct Dial: [phone]

## 2024-03-06 LAB — CULTURE, BLOOD (ROUTINE X 2)
Culture: NO GROWTH
Culture: NO GROWTH
Special Requests: ADEQUATE
Special Requests: ADEQUATE

## 2024-03-07 NOTE — Anesthesia Postprocedure Evaluation (Signed)
 Anesthesia Post Note  Patient: Anthony Frederick  Procedure(s) Performed: COLONOSCOPY  Patient location during evaluation: Phase II Anesthesia Type: General Level of consciousness: awake Pain management: pain level controlled Vital Signs Assessment: post-procedure vital signs reviewed and stable Respiratory status: spontaneous breathing and respiratory function stable Cardiovascular status: blood pressure returned to baseline and stable Postop Assessment: no headache and no apparent nausea or vomiting Anesthetic complications: no Comments: Late entry   No notable events documented.   Last Vitals:  Vitals:   02/28/24 1113 02/28/24 1116  BP: (!) 108/55 101/73  Pulse: (!) 51 (!) 57  Resp: 13 20  Temp: (!) 36.4 C   SpO2: 97% 96%    Last Pain:  Vitals:   02/28/24 1116  TempSrc:   PainSc: 0-No pain                 Yvonna JINNY Bosworth

## 2024-03-13 DIAGNOSIS — Z09 Encounter for follow-up examination after completed treatment for conditions other than malignant neoplasm: Secondary | ICD-10-CM | POA: Diagnosis not present

## 2024-04-17 ENCOUNTER — Encounter: Payer: Self-pay | Admitting: Internal Medicine

## 2024-06-10 ENCOUNTER — Ambulatory Visit: Payer: Self-pay | Admitting: Gastroenterology

## 2024-06-10 ENCOUNTER — Other Ambulatory Visit: Payer: Self-pay | Admitting: *Deleted

## 2024-06-10 ENCOUNTER — Encounter: Payer: Self-pay | Admitting: *Deleted

## 2024-06-10 ENCOUNTER — Ambulatory Visit: Admitting: Gastroenterology

## 2024-06-10 ENCOUNTER — Encounter: Payer: Self-pay | Admitting: Gastroenterology

## 2024-06-10 ENCOUNTER — Other Ambulatory Visit (HOSPITAL_COMMUNITY)
Admission: RE | Admit: 2024-06-10 | Discharge: 2024-06-10 | Disposition: A | Source: Ambulatory Visit | Attending: Gastroenterology | Admitting: Gastroenterology

## 2024-06-10 VITALS — BP 138/83 | HR 64 | Temp 97.5°F | Ht 71.0 in | Wt 267.6 lb

## 2024-06-10 DIAGNOSIS — Z860101 Personal history of adenomatous and serrated colon polyps: Secondary | ICD-10-CM | POA: Diagnosis not present

## 2024-06-10 DIAGNOSIS — R911 Solitary pulmonary nodule: Secondary | ICD-10-CM | POA: Diagnosis not present

## 2024-06-10 DIAGNOSIS — R7989 Other specified abnormal findings of blood chemistry: Secondary | ICD-10-CM | POA: Diagnosis not present

## 2024-06-10 LAB — CBC
HCT: 42.3 % (ref 39.0–52.0)
Hemoglobin: 14 g/dL (ref 13.0–17.0)
MCH: 29.2 pg (ref 26.0–34.0)
MCHC: 33.1 g/dL (ref 30.0–36.0)
MCV: 88.3 fL (ref 80.0–100.0)
Platelets: 358 K/uL (ref 150–400)
RBC: 4.79 MIL/uL (ref 4.22–5.81)
RDW: 12.9 % (ref 11.5–15.5)
WBC: 9.9 K/uL (ref 4.0–10.5)
nRBC: 0 % (ref 0.0–0.2)

## 2024-06-10 LAB — COMPREHENSIVE METABOLIC PANEL WITH GFR
ALT: 32 U/L (ref 0–44)
AST: 27 U/L (ref 15–41)
Albumin: 4.6 g/dL (ref 3.5–5.0)
Alkaline Phosphatase: 76 U/L (ref 38–126)
Anion gap: 14 (ref 5–15)
BUN: 25 mg/dL — ABNORMAL HIGH (ref 8–23)
CO2: 24 mmol/L (ref 22–32)
Calcium: 9.9 mg/dL (ref 8.9–10.3)
Chloride: 102 mmol/L (ref 98–111)
Creatinine, Ser: 1.19 mg/dL (ref 0.61–1.24)
GFR, Estimated: >60 mL/min (ref >60)
Glucose, Bld: 109 mg/dL — ABNORMAL HIGH (ref 70–99)
Potassium: 3.8 mmol/L (ref 3.5–5.1)
Sodium: 139 mmol/L (ref 135–145)
Total Bilirubin: 0.5 mg/dL (ref 0.0–1.2)
Total Protein: 7.6 g/dL (ref 6.5–8.1)

## 2024-06-10 LAB — PROTIME-INR
INR: 0.9 (ref 0.8–1.2)
Prothrombin Time: 12.8 s (ref 11.4–15.2)

## 2024-06-10 MED ORDER — PEG 3350-KCL-NA BICARB-NACL 420 G PO SOLR: 4000.0000 mL | Freq: Once | ORAL | 0 refills | Status: AC

## 2024-06-10 NOTE — Progress Notes (Signed)
 GI Office Note    Referring Provider: Shona Norleen PEDLAR, MD Primary Care Physician:  Shona Norleen PEDLAR, MD Primary Gastroenterologist: Carlin POUR. Cindie, DO  Date:  06/10/2024  ID:  Anthony Frederick, DOB [birth year], MRN [id number]  Chief Complaint   Chief Complaint  Patient presents with   Follow-up    Follow up. No problems    History of Present Illness  Anthony Frederick is a 68 y.o. male with a history of DM2, HTN, and kidney stones presenting today for follow-up post colonoscopy.  Cologuard positive 01/09/24.   Last office visit 02/03/24. Doing well. No alarm symptoms. No prior colonoscopy and no family hx of colon cancer. No upper GI symptoms. Scheduled for colonoscopy. .  Colonoscopy 02/28/2024: - Non- bleeding internal hemorrhoids.  - Diverticulosis in the sigmoid colon and in the descending colon.  - Two 4 to 5 mm polyps in the transverse colon, removed with a cold snare. Resected and retrieved.  - One 5 mm polyp in the rectum, removed with a cold snare. Resected and retrieved.  - One 30 -40 mm polyp in the rectum, removed with mucosal resection piecemeal. Resected and retrieved. Injected.  - Mucosal resection was performed. Resection and retrieval were complete. - Path: tubular adenoma and tubular adenoma with focal features suggestive of early traditional  serrated adenoma  - Advised repeat colonoscopy in 6 months for surveillance.   CT A/P 03/01/24: - hepatic steatosis -7mm solid lung nodule - non-obstructing kidney stones  Today:  Discussed the use of AI scribe software for clinical note transcription with the patient, who gave verbal consent to proceed.  He underwent a colonoscopy where multiple polyps were removed, including a large one in the rectal area. Post-procedure, he experienced significant complications, including a high fever and organ dysfunction, leading to a three-day hospitalization. He believes he had an allergic reaction to antibiotics administered after the  procedure, which he associates with subsequent liver and kidney issues. He was treated with antibiotics, which he believes worsened his condition until they were discontinued.  During the hospitalization, a CT scan revealed a pulmonary nodule in the right lower lobe of his lung, approximately 7 millimeters in size. He has not yet followed up with Dr. Shona regarding this finding and for repeat imaging. He wife has a history of smoking and hey have been together for 42 years and potential occupational exposure, which may be relevant to the lung findings.  He is currently taking Cardizem , Januvia, metformin, and blood pressure medication. He does not monitor his blood sugar at home but reports that his last A1c was 5.7. No recent changes in bowel habits, bleeding, black stool, nausea, vomiting, acid reflux, or swallowing difficulties. His appetite is good, and he has not experienced significant weight loss. No recent fevers or signs of infection.      Wt Readings from Last 6 Encounters:  06/10/24 267 lb 9.6 oz (121.4 kg)  03/01/24 261 lb 8 oz (118.6 kg)  02/28/24 256 lb (116.1 kg)  02/03/24 264 lb 9.6 oz (120 kg)  03/05/23 253 lb (114.8 kg)  02/05/23 256 lb (116.1 kg)   Body mass index is 37.32 kg/m.  Current Outpatient Medications  Medication Sig Dispense Refill   acetaminophen  (TYLENOL ) 500 MG tablet Take 1 tablet (500 mg total) by mouth every 8 (eight) hours as needed for mild pain (pain score 1-3) or headache.     diltiazem  (CARDIZEM  CD) 360 MG 24 hr capsule Take 360 mg by mouth at  bedtime.     JANUVIA 100 MG tablet Take 100 mg by mouth daily.     losartan -hydrochlorothiazide (HYZAAR) 100-25 MG tablet Take 1 tablet by mouth daily.     metFORMIN (GLUCOPHAGE) 1000 MG tablet Take 1,000 mg by mouth 2 (two) times daily.     No current facility-administered medications for this visit.    Past Medical History:  Diagnosis Date   Arthritis    Hernia of abdominal wall    History of kidney  stones    Hypertension    Left ureteral calculus    Type 2 diabetes mellitus (HCC)     Past Surgical History:  Procedure Laterality Date   ANKLE FRACTURE SURGERY Left    COLONOSCOPY N/A 02/28/2024   Procedure: COLONOSCOPY;  Surgeon: Cindie Carlin POUR, DO;  [facility]: [facility];  Service: Endoscopy;  Laterality: N/A;  11:30 am, asa 2   CYSTOSCOPY W/ RETROGRADES Left 04/19/2014   Procedure: CYSTOSCOPY WITH RETROGRADE PYELOGRAM/ INTERPRETATION;  Surgeon: Arlena LILLETTE Gal, MD;  Location: [facility];  Service: Urology;  Laterality: Left;   CYSTOSCOPY WITH STENT PLACEMENT Left 04/19/2014   Procedure: CYSTOSCOPY WITH STENT PLACEMENT;  Surgeon: Arlena LILLETTE Gal, MD;  Location: [facility];  Service: Urology;  Laterality: Left;   CYSTOSCOPY WITH URETEROSCOPY Left 04/19/2014   Procedure: CYSTOSCOPY WITH URETEROSCOPY/ BASKET EXTRACTION;  Surgeon: Arlena LILLETTE Gal, MD;  Location: [facility];  Service: Urology;  Laterality: Left;   RADIAL HEAD ARTHROPLASTY Left 10/27/2019   Procedure: LEFT RADIAL HEAD ARTHROPLASTY;  Surgeon: Josefina Chew, MD;  Location: [facility];  Service: Orthopedics;  Laterality: Left;   removal axillary cyst Left    right knee replacement     UMBILICAL HERNIA REPAIR N/A 08/19/2017   Procedure: HERNIA REPAIR UMBILICAL ADULT WITH MESH;  Surgeon: Kallie Manuelita BROCKS, MD;  Location: [facility];  Service: General;  Laterality: N/A;    Family History  Problem Relation Age of Onset   Diabetes Mother    Melanoma Father    Colon cancer Neg Hx     Allergies as of 06/10/2024 - Review Complete 06/10/2024  Allergen Reaction Noted   Levaquin [levofloxacin] Rash 10/21/2019    Social History   Socioeconomic History   Marital status: Married    Spouse name: Not on file   Number of children: 1   Years of education: 12   Highest education level: Not on file  Occupational History   Not on file  Tobacco Use   Smoking status:  Former    Current packs/day: 0.00    Types: Cigarettes    Quit date: 07/09/1958    Years since quitting: 65.9   Smokeless tobacco: Never  Vaping Use   Vaping status: Never Used  Substance and Sexual Activity   Alcohol use: No   Drug use: No   Sexual activity: Yes    Birth control/protection: None  Other Topics Concern   Not on file  Social History Narrative   Not on file   Social Drivers of Health   Financial Resource Strain: Not on file  Food Insecurity: No Food Insecurity (03/05/2024)   Hunger Vital Sign    Worried About Running Out of Food in the Last Year: Never true    Ran Out of Food in the Last Year: Never true  Transportation Needs: No Transportation Needs (03/05/2024)   PRAPARE - Administrator, Civil Service (Medical): No    Lack of Transportation (Non-Medical): No  Physical Activity: Not on file  Stress: Not on file  Social Connections: Moderately Isolated (03/01/2024)   Social Connection and Isolation Panel    Frequency of Communication with Friends and Family: More than three times a week    Frequency of Social Gatherings with Friends and Family: More than three times a week    Attends Religious Services: Never    Database Administrator or Organizations: No    Attends Banker Meetings: Never    Marital Status: Married    Review of Systems   Gen: Denies fever, chills, anorexia. Denies fatigue, weakness, weight loss.  CV: Denies chest pain, palpitations, syncope, peripheral edema, and claudication. Resp: Denies dyspnea at rest, cough, wheezing, coughing up blood, and pleurisy. GI: See HPI Derm: Denies rash, itching, dry skin Psych: Denies depression, anxiety, memory loss, confusion. No homicidal or suicidal ideation.  Heme: Denies bruising, bleeding, and enlarged lymph nodes.  Physical Exam   BP 138/83 (BP Location: Right Arm, Patient Position: Sitting, Cuff Size: Large)   Pulse 64   Temp (!) 97.5 F (36.4 C) (Temporal)   Ht 5'  11 (1.803 m)   Wt 267 lb 9.6 oz (121.4 kg)   BMI 37.32 kg/m   General:   Alert and oriented. No distress noted. Pleasant and cooperative.  Head:  Normocephalic and atraumatic. Eyes:  Conjuctiva clear without scleral icterus. Lungs:  Clear to auscultation bilaterally. No wheezes, rales, or rhonchi. No distress.  Heart:  S1, S2 present without murmurs appreciated.  Abdomen:  +BS, soft, non-tender and non-distended but rounded/protuberant. No rebound or guarding. No HSM or masses noted. Rectal: deferred Msk:  Symmetrical without gross deformities. Normal posture. Extremities:  Without edema. Neurologic:  Alert and  oriented x4 Psych:  Alert and cooperative. Normal mood and affect.  Assessment & Plan  Anthony Frederick is a 68 y.o. male presenting today for follow up post colonoscopy.  Assessment and Plan    Personal history of adenomatous and serrated colon polyps with previous positive Cologuard Previous colonoscopy revealed a large polyp with features suggestive of early advanced adenoma (serrated adenoma and a fragment of tubular adenoma). Post-procedure hospitalization due to fever and sepsis, likely secondary to opportunistic bacterial infection post-polypectomy or other unidentified source. Risks of repeat colonoscopy include infection, perforation, and bleeding, but benefits include ensuring complete removal of polyp and preventing potential cancer development. He expressed concern about hospitalization but agreed to proceed with repeat colonoscopy. - Will schedule repeat colonoscopy between now and February.   Drug-induced liver injury Liver injury likely secondary to antibiotics post-colonoscopy. Liver enzymes elevated during hospitalization, with AST of 132 and ALT of 235. No acute hepatitis panel abnormalities. Liver injury expected to resolve as antibiotics were discontinued but no recent recheck has been performed since hospitalization. No pain, jaundice, pruritus'. Prior CT in  August without hepatoma however steatosis is seen. Will calculate Fib-4 score and if needed will do additional serologies if LFTs remain elevated.  - CBC, CMP, INR ordered - Calculate Fib-4 score after labs obtained.   Lung nodule Chest CT revealed a 7 mm solid lung nodule in the right lower lobe. Radiology recommended non-contrast chest CT in 3-6 months to confirm persistence. No other nodules seen. Follow-up with PCP recommended for further management. - Sent letter to Dr. Milford office to follow up on lung nodule and order repeat chest CT per radiology recommendations.    Procedure order: Proceed with colonoscopy with propofol  by Dr. Cindie  in near future: the  risks, benefits, and alternatives have been discussed with the patient in detail. The patient states understanding and desires to proceed. ASA 2  Hold Januvia and metformin morning of procedure  Half dose metformin in the morning and evening prior to procedure  Follow up   Follow up as needed post colonoscopy, sooner follow up if needed pending lab results.    Charmaine Melia, MSN, FNP-BC, AGACNP-BC [facility]

## 2024-06-10 NOTE — Progress Notes (Incomplete)
 GI Office Note    Referring Provider: Shona Norleen PEDLAR, MD Primary Care Physician:  Shona Norleen PEDLAR, MD Primary Gastroenterologist: Carlin POUR. Cindie, DO  Date:  06/10/2024  ID:  Anthony Frederick, DOB December 25, 1955, MRN [id number]  Chief Complaint   No chief complaint on file.  History of Present Illness  [name] is a 68 y.o. male with a history of *** presenting today with complaint of   Cologuard positive 01/09/24.   Last office visit 02/03/24. Doing well. No alarm symptoms. No prior colonoscopy and no family hx of colon cancer. No upper GI symptoms. Scheduled for colonoscopy. .  CT A/P 03/01/24: - ***  Today:     Wt Readings from Last 6 Encounters:  03/01/24 261 lb 8 oz (118.6 kg)  02/28/24 256 lb (116.1 kg)  02/03/24 264 lb 9.6 oz (120 kg)  03/05/23 253 lb (114.8 kg)  02/05/23 256 lb (116.1 kg)  10/22/19 270 lb 8 oz (122.7 kg)    There is no height or weight on file to calculate BMI.   Current Outpatient Medications  Medication Sig Dispense Refill  . acetaminophen  (TYLENOL ) 500 MG tablet Take 1 tablet (500 mg total) by mouth every 8 (eight) hours as needed for mild pain (pain score 1-3) or headache.    . diltiazem  (CARDIZEM  CD) 360 MG 24 hr capsule Take 360 mg by mouth at bedtime.    SABRA JANUVIA 100 MG tablet Take 100 mg by mouth daily.    . losartan -hydrochlorothiazide (HYZAAR) 100-25 MG tablet Take 1 tablet by mouth daily.    . metFORMIN (GLUCOPHAGE) 1000 MG tablet Take 1,000 mg by mouth 2 (two) times daily.     No current facility-administered medications for this visit.    Past Medical History:  Diagnosis Date  . Arthritis   . Hernia of abdominal wall   . History of kidney stones   . Hypertension   . Left ureteral calculus   . Type 2 diabetes mellitus (HCC)     Past Surgical History:  Procedure Laterality Date  . ANKLE FRACTURE SURGERY Left   . COLONOSCOPY N/A 02/28/2024   Procedure: COLONOSCOPY;  Surgeon: Cindie Carlin POUR, DO;  [facility];  Service: Endoscopy;  Laterality: N/A;  11:30 am, asa 2  . CYSTOSCOPY W/ RETROGRADES Left 04/19/2014   Procedure: CYSTOSCOPY WITH RETROGRADE PYELOGRAM/ INTERPRETATION;  Surgeon: Arlena LILLETTE Gal, MD;  Location: [facility];  Service: Urology;  Laterality: Left;  . CYSTOSCOPY WITH STENT PLACEMENT Left 04/19/2014   Procedure: CYSTOSCOPY WITH STENT PLACEMENT;  Surgeon: Arlena LILLETTE Gal, MD;  Location: [facility];  Service: Urology;  Laterality: Left;  . CYSTOSCOPY WITH URETEROSCOPY Left 04/19/2014   Procedure: CYSTOSCOPY WITH URETEROSCOPY/ BASKET EXTRACTION;  Surgeon: Arlena LILLETTE Gal, MD;  Location: [facility];  Service: Urology;  Laterality: Left;  . RADIAL HEAD ARTHROPLASTY Left 10/27/2019   Procedure: LEFT RADIAL HEAD ARTHROPLASTY;  Surgeon: Josefina Chew, MD;  Location: [facility];  Service: Orthopedics;  Laterality: Left;  . removal axillary cyst Left   . right knee replacement    . UMBILICAL HERNIA REPAIR N/A 08/19/2017   Procedure: HERNIA REPAIR UMBILICAL ADULT WITH MESH;  Surgeon: Kallie Manuelita BROCKS, MD;  Location: [facility];  Service: General;  Laterality: N/A;    Family History  Problem Relation Age of Onset  . Diabetes Mother   . Melanoma Father   . Colon cancer Neg Hx     Allergies as  of 06/10/2024 - Review Complete 03/05/2024  Allergen Reaction Noted  . Levaquin [levofloxacin] Rash 10/21/2019    Social History   Socioeconomic History  . Marital status: Married    Spouse name: Not on file  . Number of children: 1  . Years of education: 75  . Highest education level: Not on file  Occupational History  . Not on file  Tobacco Use  . Smoking status: Former    Current packs/day: 0.00    Types: Cigarettes    Quit date: 07/09/1958    Years since quitting: 65.9  . Smokeless tobacco: Never  Vaping Use  . Vaping status: Never Used  Substance and Sexual Activity  . Alcohol use: No  . Drug use: No  . Sexual  activity: Yes    Birth control/protection: None  Other Topics Concern  . Not on file  Social History Narrative  . Not on file   Social Drivers of Health   Financial Resource Strain: Not on file  Food Insecurity: No Food Insecurity (03/05/2024)   Hunger Vital Sign   . Worried About Programme Researcher, Broadcasting/film/video in the Last Year: Never true   . Ran Out of Food in the Last Year: Never true  Transportation Needs: No Transportation Needs (03/05/2024)   PRAPARE - Transportation   . Lack of Transportation (Medical): No   . Lack of Transportation (Non-Medical): No  Physical Activity: Not on file  Stress: Not on file  Social Connections: Moderately Isolated (03/01/2024)   Social Connection and Isolation Panel   . Frequency of Communication with Friends and Family: More than three times a week   . Frequency of Social Gatherings with Friends and Family: More than three times a week   . Attends Religious Services: Never   . Active Member of Clubs or Organizations: No   . Attends Banker Meetings: Never   . Marital Status: Married    Review of Systems   Gen: Denies fever, chills, anorexia. Denies fatigue, weakness, weight loss.  CV: Denies chest pain, palpitations, syncope, peripheral edema, and claudication. Resp: Denies dyspnea at rest, cough, wheezing, coughing up blood, and pleurisy. GI: See HPI Derm: Denies rash, itching, dry skin Psych: Denies depression, anxiety, memory loss, confusion. No homicidal or suicidal ideation.  Heme: Denies bruising, bleeding, and enlarged lymph nodes.  Physical Exam   There were no vitals taken for this visit.  General:   Alert and oriented. No distress noted. Pleasant and cooperative.  Head:  Normocephalic and atraumatic. Eyes:  Conjuctiva clear without scleral icterus. Mouth:  Oral mucosa pink and moist. Good dentition. No lesions. Lungs:  Clear to auscultation bilaterally. No wheezes, rales, or rhonchi. No distress.  Heart:  S1, S2 present  without murmurs appreciated.  Abdomen:  +BS, soft, non-tender and non-distended. No rebound or guarding. No HSM or masses noted. Rectal: *** Msk:  Symmetrical without gross deformities. Normal posture. Extremities:  Without edema. Neurologic:  Alert and  oriented x4 Psych:  Alert and cooperative. Normal mood and affect.  Assessment & Plan  Anthony Frederick is a 68 y.o. male presenting today with ***   Follow up   ***Follow up ***    Charmaine Melia, MSN, FNP-BC, AGACNP-BC [facility]

## 2024-06-10 NOTE — H&P (View-Only) (Signed)
 GI Office Note    Referring Provider: Shona Norleen PEDLAR, MD Primary Care Physician:  Shona Norleen PEDLAR, MD Primary Gastroenterologist: Carlin POUR. Cindie, DO  Date:  06/10/2024  ID:  Anthony Frederick, DOB [birth year], MRN [id number]  Chief Complaint   Chief Complaint  Patient presents with   Follow-up    Follow up. No problems    History of Present Illness  Anthony Frederick is a 68 y.o. male with a history of DM2, HTN, and kidney stones presenting today for follow-up post colonoscopy.  Cologuard positive 01/09/24.   Last office visit 02/03/24. Doing well. No alarm symptoms. No prior colonoscopy and no family hx of colon cancer. No upper GI symptoms. Scheduled for colonoscopy. .  Colonoscopy 02/28/2024: - Non- bleeding internal hemorrhoids.  - Diverticulosis in the sigmoid colon and in the descending colon.  - Two 4 to 5 mm polyps in the transverse colon, removed with a cold snare. Resected and retrieved.  - One 5 mm polyp in the rectum, removed with a cold snare. Resected and retrieved.  - One 30 -40 mm polyp in the rectum, removed with mucosal resection piecemeal. Resected and retrieved. Injected.  - Mucosal resection was performed. Resection and retrieval were complete. - Path: tubular adenoma and tubular adenoma with focal features suggestive of early traditional  serrated adenoma  - Advised repeat colonoscopy in 6 months for surveillance.   CT A/P 03/01/24: - hepatic steatosis -7mm solid lung nodule - non-obstructing kidney stones  Today:  Discussed the use of AI scribe software for clinical note transcription with the patient, who gave verbal consent to proceed.  He underwent a colonoscopy where multiple polyps were removed, including a large one in the rectal area. Post-procedure, he experienced significant complications, including a high fever and organ dysfunction, leading to a three-day hospitalization. He believes he had an allergic reaction to antibiotics administered after the  procedure, which he associates with subsequent liver and kidney issues. He was treated with antibiotics, which he believes worsened his condition until they were discontinued.  During the hospitalization, a CT scan revealed a pulmonary nodule in the right lower lobe of his lung, approximately 7 millimeters in size. He has not yet followed up with Dr. Shona regarding this finding and for repeat imaging. He wife has a history of smoking and hey have been together for 42 years and potential occupational exposure, which may be relevant to the lung findings.  He is currently taking Cardizem , Januvia, metformin, and blood pressure medication. He does not monitor his blood sugar at home but reports that his last A1c was 5.7. No recent changes in bowel habits, bleeding, black stool, nausea, vomiting, acid reflux, or swallowing difficulties. His appetite is good, and he has not experienced significant weight loss. No recent fevers or signs of infection.      Wt Readings from Last 6 Encounters:  06/10/24 267 lb 9.6 oz (121.4 kg)  03/01/24 261 lb 8 oz (118.6 kg)  02/28/24 256 lb (116.1 kg)  02/03/24 264 lb 9.6 oz (120 kg)  03/05/23 253 lb (114.8 kg)  02/05/23 256 lb (116.1 kg)   Body mass index is 37.32 kg/m.  Current Outpatient Medications  Medication Sig Dispense Refill   acetaminophen  (TYLENOL ) 500 MG tablet Take 1 tablet (500 mg total) by mouth every 8 (eight) hours as needed for mild pain (pain score 1-3) or headache.     diltiazem  (CARDIZEM  CD) 360 MG 24 hr capsule Take 360 mg by mouth at  bedtime.     JANUVIA 100 MG tablet Take 100 mg by mouth daily.     losartan -hydrochlorothiazide (HYZAAR) 100-25 MG tablet Take 1 tablet by mouth daily.     metFORMIN (GLUCOPHAGE) 1000 MG tablet Take 1,000 mg by mouth 2 (two) times daily.     No current facility-administered medications for this visit.    Past Medical History:  Diagnosis Date   Arthritis    Hernia of abdominal wall    History of kidney  stones    Hypertension    Left ureteral calculus    Type 2 diabetes mellitus (HCC)     Past Surgical History:  Procedure Laterality Date   ANKLE FRACTURE SURGERY Left    COLONOSCOPY N/A 02/28/2024   Procedure: COLONOSCOPY;  Surgeon: Cindie Carlin POUR, DO;  Location: [facility];  Service: Endoscopy;  Laterality: N/A;  11:30 am, asa 2   CYSTOSCOPY W/ RETROGRADES Left 04/19/2014   Procedure: CYSTOSCOPY WITH RETROGRADE PYELOGRAM/ INTERPRETATION;  Surgeon: Arlena LILLETTE Gal, MD;  Location: [facility];  Service: Urology;  Laterality: Left;   CYSTOSCOPY WITH STENT PLACEMENT Left 04/19/2014   Procedure: CYSTOSCOPY WITH STENT PLACEMENT;  Surgeon: Arlena LILLETTE Gal, MD;  Location: [facility];  Service: Urology;  Laterality: Left;   CYSTOSCOPY WITH URETEROSCOPY Left 04/19/2014   Procedure: CYSTOSCOPY WITH URETEROSCOPY/ BASKET EXTRACTION;  Surgeon: Arlena LILLETTE Gal, MD;  Location: [facility];  Service: Urology;  Laterality: Left;   RADIAL HEAD ARTHROPLASTY Left 10/27/2019   Procedure: LEFT RADIAL HEAD ARTHROPLASTY;  Surgeon: Josefina Chew, MD;  Location: [facility];  Service: Orthopedics;  Laterality: Left;   removal axillary cyst Left    right knee replacement     UMBILICAL HERNIA REPAIR N/A 08/19/2017   Procedure: HERNIA REPAIR UMBILICAL ADULT WITH MESH;  Surgeon: Kallie Manuelita BROCKS, MD;  Location: [facility];  Service: General;  Laterality: N/A;    Family History  Problem Relation Age of Onset   Diabetes Mother    Melanoma Father    Colon cancer Neg Hx     Allergies as of 06/10/2024 - Review Complete 06/10/2024  Allergen Reaction Noted   Levaquin [levofloxacin] Rash 10/21/2019    Social History   Socioeconomic History   Marital status: Married    Spouse name: Not on file   Number of children: 1   Years of education: 12   Highest education level: Not on file  Occupational History   Not on file  Tobacco Use   Smoking status:  Former    Current packs/day: 0.00    Types: Cigarettes    Quit date: 07/09/1958    Years since quitting: 65.9   Smokeless tobacco: Never  Vaping Use   Vaping status: Never Used  Substance and Sexual Activity   Alcohol use: No   Drug use: No   Sexual activity: Yes    Birth control/protection: None  Other Topics Concern   Not on file  Social History Narrative   Not on file   Social Drivers of Health   Financial Resource Strain: Not on file  Food Insecurity: No Food Insecurity (03/05/2024)   Hunger Vital Sign    Worried About Running Out of Food in the Last Year: Never true    Ran Out of Food in the Last Year: Never true  Transportation Needs: No Transportation Needs (03/05/2024)   PRAPARE - Administrator, Civil Service (Medical): No    Lack of Transportation (Non-Medical): No  Physical Activity: Not on file  Stress: Not on file  Social Connections: Moderately Isolated (03/01/2024)   Social Connection and Isolation Panel    Frequency of Communication with Friends and Family: More than three times a week    Frequency of Social Gatherings with Friends and Family: More than three times a week    Attends Religious Services: Never    Database Administrator or Organizations: No    Attends Banker Meetings: Never    Marital Status: Married    Review of Systems   Gen: Denies fever, chills, anorexia. Denies fatigue, weakness, weight loss.  CV: Denies chest pain, palpitations, syncope, peripheral edema, and claudication. Resp: Denies dyspnea at rest, cough, wheezing, coughing up blood, and pleurisy. GI: See HPI Derm: Denies rash, itching, dry skin Psych: Denies depression, anxiety, memory loss, confusion. No homicidal or suicidal ideation.  Heme: Denies bruising, bleeding, and enlarged lymph nodes.  Physical Exam   BP 138/83 (BP Location: Right Arm, Patient Position: Sitting, Cuff Size: Large)   Pulse 64   Temp (!) 97.5 F (36.4 C) (Temporal)   Ht 5'  11 (1.803 m)   Wt 267 lb 9.6 oz (121.4 kg)   BMI 37.32 kg/m   General:   Alert and oriented. No distress noted. Pleasant and cooperative.  Head:  Normocephalic and atraumatic. Eyes:  Conjuctiva clear without scleral icterus. Lungs:  Clear to auscultation bilaterally. No wheezes, rales, or rhonchi. No distress.  Heart:  S1, S2 present without murmurs appreciated.  Abdomen:  +BS, soft, non-tender and non-distended but rounded/protuberant. No rebound or guarding. No HSM or masses noted. Rectal: deferred Msk:  Symmetrical without gross deformities. Normal posture. Extremities:  Without edema. Neurologic:  Alert and  oriented x4 Psych:  Alert and cooperative. Normal mood and affect.  Assessment & Plan  Anthony Frederick is a 68 y.o. male presenting today for follow up post colonoscopy.  Assessment and Plan    Personal history of adenomatous and serrated colon polyps with previous positive Cologuard Previous colonoscopy revealed a large polyp with features suggestive of early advanced adenoma (serrated adenoma and a fragment of tubular adenoma). Post-procedure hospitalization due to fever and sepsis, likely secondary to opportunistic bacterial infection post-polypectomy or other unidentified source. Risks of repeat colonoscopy include infection, perforation, and bleeding, but benefits include ensuring complete removal of polyp and preventing potential cancer development. He expressed concern about hospitalization but agreed to proceed with repeat colonoscopy. - Will schedule repeat colonoscopy between now and February.   Drug-induced liver injury Liver injury likely secondary to antibiotics post-colonoscopy. Liver enzymes elevated during hospitalization, with AST of 132 and ALT of 235. No acute hepatitis panel abnormalities. Liver injury expected to resolve as antibiotics were discontinued but no recent recheck has been performed since hospitalization. No pain, jaundice, pruritus'. Prior CT in  August without hepatoma however steatosis is seen. Will calculate Fib-4 score and if needed will do additional serologies if LFTs remain elevated.  - CBC, CMP, INR ordered - Calculate Fib-4 score after labs obtained.   Lung nodule Chest CT revealed a 7 mm solid lung nodule in the right lower lobe. Radiology recommended non-contrast chest CT in 3-6 months to confirm persistence. No other nodules seen. Follow-up with PCP recommended for further management. - Sent letter to Dr. Milford office to follow up on lung nodule and order repeat chest CT per radiology recommendations.    Procedure order: Proceed with colonoscopy with propofol  by Dr. Cindie  in near future: the  risks, benefits, and alternatives have been discussed with the patient in detail. The patient states understanding and desires to proceed. ASA 2  Hold Januvia and metformin morning of procedure  Half dose metformin in the morning and evening prior to procedure  Follow up   Follow up as needed post colonoscopy, sooner follow up if needed pending lab results.    Charmaine Melia, MSN, FNP-BC, AGACNP-BC [facility]

## 2024-06-10 NOTE — Patient Instructions (Addendum)
 We will plan on Colonoscopy early February to re-evaluate the area of the rectal polyp and reassess for any additional polyps once we get this scheduled.  If you have any changes in your medications including any additional diabetes medication or changes in frequency of your medications please let us  know prior to procedure so we can give you proper instructions.  We will send a message to Dr. Milford office asking them to follow-up on the lung nodule that was found during your CT scan during your hospitalization in August.  Given the prior elevation in the liver enzymes I do recommend is rechecking these given they are still quite elevated on her discharge and I do not see any follow-up labs since then.  If they remain elevated then we will need to do some additional lab work to assess for the cause.  We will plan to follow-up after your colonoscopy as needed pending if any further follow-up is needed in regards to your elevated liver enzymes.  It was a pleasure to see you today. I want to create trusting relationships with patients. If you receive a survey regarding your visit,  I greatly appreciate you taking time to fill this out on paper or through your MyChart. I value your feedback.  Charmaine Melia, MSN, FNP-BC, AGACNP-BC [facility]

## 2024-06-17 ENCOUNTER — Encounter (HOSPITAL_COMMUNITY): Payer: Self-pay

## 2024-06-17 ENCOUNTER — Inpatient Hospital Stay (HOSPITAL_COMMUNITY): Admission: RE | Admit: 2024-06-17 | Discharge: 2024-06-17 | Attending: Internal Medicine

## 2024-06-23 ENCOUNTER — Ambulatory Visit (HOSPITAL_COMMUNITY): Admitting: Anesthesiology

## 2024-06-23 ENCOUNTER — Encounter (HOSPITAL_COMMUNITY): Payer: Self-pay | Admitting: Internal Medicine

## 2024-06-23 ENCOUNTER — Ambulatory Visit (HOSPITAL_COMMUNITY)
Admission: RE | Admit: 2024-06-23 | Discharge: 2024-06-23 | Disposition: A | Attending: Internal Medicine | Admitting: Internal Medicine

## 2024-06-23 ENCOUNTER — Encounter (HOSPITAL_COMMUNITY): Admission: RE | Disposition: A | Payer: Self-pay | Source: Home / Self Care | Attending: Internal Medicine

## 2024-06-23 DIAGNOSIS — Z7984 Long term (current) use of oral hypoglycemic drugs: Secondary | ICD-10-CM | POA: Diagnosis not present

## 2024-06-23 DIAGNOSIS — D123 Benign neoplasm of transverse colon: Secondary | ICD-10-CM | POA: Insufficient documentation

## 2024-06-23 DIAGNOSIS — T50905A Adverse effect of unspecified drugs, medicaments and biological substances, initial encounter: Secondary | ICD-10-CM | POA: Diagnosis not present

## 2024-06-23 DIAGNOSIS — Z87891 Personal history of nicotine dependence: Secondary | ICD-10-CM | POA: Diagnosis not present

## 2024-06-23 DIAGNOSIS — Z1211 Encounter for screening for malignant neoplasm of colon: Secondary | ICD-10-CM | POA: Diagnosis not present

## 2024-06-23 DIAGNOSIS — Z09 Encounter for follow-up examination after completed treatment for conditions other than malignant neoplasm: Secondary | ICD-10-CM | POA: Diagnosis present

## 2024-06-23 DIAGNOSIS — K648 Other hemorrhoids: Secondary | ICD-10-CM | POA: Insufficient documentation

## 2024-06-23 DIAGNOSIS — E119 Type 2 diabetes mellitus without complications: Secondary | ICD-10-CM | POA: Diagnosis not present

## 2024-06-23 DIAGNOSIS — K5730 Diverticulosis of large intestine without perforation or abscess without bleeding: Secondary | ICD-10-CM | POA: Diagnosis not present

## 2024-06-23 DIAGNOSIS — Z7722 Contact with and (suspected) exposure to environmental tobacco smoke (acute) (chronic): Secondary | ICD-10-CM | POA: Diagnosis not present

## 2024-06-23 DIAGNOSIS — I10 Essential (primary) hypertension: Secondary | ICD-10-CM | POA: Diagnosis not present

## 2024-06-23 DIAGNOSIS — D128 Benign neoplasm of rectum: Secondary | ICD-10-CM | POA: Diagnosis not present

## 2024-06-23 DIAGNOSIS — R911 Solitary pulmonary nodule: Secondary | ICD-10-CM | POA: Insufficient documentation

## 2024-06-23 DIAGNOSIS — K621 Rectal polyp: Secondary | ICD-10-CM | POA: Diagnosis not present

## 2024-06-23 DIAGNOSIS — Z79899 Other long term (current) drug therapy: Secondary | ICD-10-CM | POA: Insufficient documentation

## 2024-06-23 DIAGNOSIS — K719 Toxic liver disease, unspecified: Secondary | ICD-10-CM | POA: Diagnosis not present

## 2024-06-23 DIAGNOSIS — K635 Polyp of colon: Secondary | ICD-10-CM | POA: Diagnosis not present

## 2024-06-23 HISTORY — PX: POLYPECTOMY: SHX149

## 2024-06-23 HISTORY — PX: COLONOSCOPY: SHX5424

## 2024-06-23 LAB — GLUCOSE, CAPILLARY: Glucose-Capillary: 128 mg/dL — ABNORMAL HIGH (ref 70–99)

## 2024-06-23 SURGERY — COLONOSCOPY
Anesthesia: Monitor Anesthesia Care

## 2024-06-23 MED ORDER — LACTATED RINGERS IV SOLN: INTRAVENOUS | Status: DC

## 2024-06-23 MED ORDER — PROPOFOL 10 MG/ML IV BOLUS
INTRAVENOUS | Status: AC
  Filled 2024-06-23: qty 20

## 2024-06-23 MED ORDER — PROPOFOL 10 MG/ML IV BOLUS
INTRAVENOUS | Status: DC | PRN
  Administered 2024-06-23: 09:00:00 100 mg via INTRAVENOUS
  Administered 2024-06-23 (×3): 50 mg via INTRAVENOUS
  Administered 2024-06-23: 09:00:00 100 mg via INTRAVENOUS

## 2024-06-23 NOTE — Discharge Instructions (Addendum)
°  Colonoscopy Discharge Instructions  Read the instructions outlined below and refer to this sheet in the next few weeks. These discharge instructions provide you with general information on caring for yourself after you leave the hospital. Your doctor may also give you specific instructions. While your treatment has been planned according to the most current medical practices available, unavoidable complications occasionally occur.   ACTIVITY You may resume your regular activity, but move at a slower pace for the next 24 hours.  Take frequent rest periods for the next 24 hours.  Walking will help get rid of the air and reduce the bloated feeling in your belly (abdomen).  No driving for 24 hours (because of the medicine (anesthesia) used during the test).   Do not sign any important legal documents or operate any machinery for 24 hours (because of the anesthesia used during the test).  NUTRITION Drink plenty of fluids.  You may resume your normal diet as instructed by your doctor.  Begin with a light meal and progress to your normal diet. Heavy or fried foods are harder to digest and may make you feel sick to your stomach (nauseated).  Avoid alcoholic beverages for 24 hours or as instructed.  MEDICATIONS You may resume your normal medications unless your doctor tells you otherwise.  WHAT YOU CAN EXPECT TODAY Some feelings of bloating in the abdomen.  Passage of more gas than usual.  Spotting of blood in your stool or on the toilet paper.  IF YOU HAD POLYPS REMOVED DURING THE COLONOSCOPY: No aspirin products for 7 days or as instructed.  No alcohol for 7 days or as instructed.  Eat a soft diet for the next 24 hours.  FINDING OUT THE RESULTS OF YOUR TEST Not all test results are available during your visit. If your test results are not back during the visit, make an appointment with your caregiver to find out the results. Do not assume everything is normal if you have not heard from your  caregiver or the medical facility. It is important for you to follow up on all of your test results.  SEEK IMMEDIATE MEDICAL ATTENTION IF: You have more than a spotting of blood in your stool.  Your belly is swollen (abdominal distention).  You are nauseated or vomiting.  You have a temperature over 101.  You have abdominal pain or discomfort that is severe or gets worse throughout the day.   Your colonoscopy revealed 2 small polyp(s) which I removed successfully.   The site from your prior large polyp resection looked great.  There was a small area of residual polyp tissue which I removed today.  Await pathology results, my office will contact you. I recommend repeating colonoscopy in 3 years for surveillance purposes, depending on pathology results.  You also have diverticulosis and internal hemorrhoids. I would recommend increasing fiber in your diet or adding OTC Benefiber/Metamucil. Be sure to drink at least 4 to 6 glasses of water daily.   Otherwise follow up with GI as needed.    I hope you have a great rest of your week!  Anthony Frederick. Cindie, D.O. Gastroenterology and Hepatology [facility]

## 2024-06-23 NOTE — Op Note (Signed)
 [facility] Patient Name: Anthony Frederick Procedure Date: 06/23/2024 8:22 AM MRN: [id number] Date of Birth: 12-30-1955 Attending MD: Carlin POUR. Cindie , [place], [id number] CSN: [id number] Age: 68 Admit Type: Outpatient Procedure:                Colonoscopy Indications:              Surveillance: Personal history of piecemeal removal                            of adenoma on last colonoscopy (less than 1 year                            ago) Providers:                Carlin POUR. Cindie, DO, Devere Lodge, Bascom Blush Referring MD:              Medicines:                See the Anesthesia note for documentation of the                            administered medications Complications:            No immediate complications. Estimated Blood Loss:     Estimated blood loss was minimal. Procedure:                Pre-Anesthesia Assessment:                           - The anesthesia plan was to use monitored                            anesthesia care (MAC).                           After obtaining informed consent, the colonoscope                            was passed under direct vision. Throughout the                            procedure, the patient's blood pressure, pulse, and                            oxygen saturations were monitored continuously. The                            PCF-HQ190L ([id number]) Peds Colon was introduced                            through the anus and advanced to the the terminal                            ileum, with identification of the appendiceal                            orifice and IC  valve. The colonoscopy was performed                            without difficulty. The patient tolerated the                            procedure well. The quality of the bowel                            preparation was evaluated using the BBPS [facility]                            Bowel Preparation Scale) with scores of: Right                            Colon = 3, Transverse Colon  = 3 and Left Colon = 3                            (entire mucosa seen well with no residual staining,                            small fragments of stool or opaque liquid). The                            total BBPS score equals 9. Scope In: 8:41:40 AM Scope Out: 9:01:45 AM Scope Withdrawal Time: 0 hours 14 minutes 54 seconds  Total Procedure Duration: 0 hours 20 minutes 5 seconds  Findings:      Non-bleeding internal hemorrhoids were found.      Scattered large-mouthed and small-mouthed diverticula were found in the       sigmoid colon, descending colon and transverse colon.      A 4 mm polyp was found in the transverse colon. The polyp was sessile.       The polyp was removed with a cold snare. Resection and retrieval were       complete.      A 4 mm polyp was found in the rectum. The polyp was sessile. The polyp       was removed with a cold snare. Resection and retrieval were complete.      A large post polypectomy scar was found in the rectum. There was small       piece of residual polyp tissue. The polyp tissue was removed with a cold       snare. Resection and retrieval were complete.      The terminal ileum appeared normal. Impression:               - Non-bleeding internal hemorrhoids.                           - Diverticulosis in the sigmoid colon, in the                            descending colon and in the transverse colon.                           -  One 4 mm polyp in the transverse colon, removed                            with a cold snare. Resected and retrieved.                           - One 4 mm polyp in the rectum, removed with a cold                            snare. Resected and retrieved.                           - Post-polypectomy scar in the rectum.                           - The examined portion of the ileum was normal. Moderate Sedation:      Per Anesthesia Care Recommendation:           - Patient has a contact number available for                             emergencies. The signs and symptoms of potential                            delayed complications were discussed with the                            patient. Return to normal activities tomorrow.                            Written discharge instructions were provided to the                            patient.                           - Resume previous diet.                           - Continue present medications.                           - Await pathology results.                           - Repeat colonoscopy in 3 years for surveillance.                           - Return to GI clinic PRN. Procedure Code(s):        --- Professional ---                           [phone], Colonoscopy, flexible; with removal of                            tumor(s), polyp(s),  or other lesion(s) by snare                            technique Diagnosis Code(s):        --- Professional ---                           K64.8, Other hemorrhoids                           D12.3, Benign neoplasm of transverse colon (hepatic                            flexure or splenic flexure)                           D12.8, Benign neoplasm of rectum                           Z98.890, Other specified postprocedural states                           Z09, Encounter for follow-up examination after                            completed treatment for conditions other than                            malignant neoplasm                           Z86.010, Personal history of colonic polyps                           K57.30, Diverticulosis of large intestine without                            perforation or abscess without bleeding CPT copyright 2022 American Medical Association. All rights reserved. The codes documented in this report are preliminary and upon coder review may  be revised to meet current compliance requirements. Carlin POUR. Cindie, DO Carlin POUR. Elier Zellars, DO 06/23/2024 9:07:13 AM This report has been signed  electronically. Number of Addenda: 0

## 2024-06-23 NOTE — Anesthesia Preprocedure Evaluation (Signed)
Anesthesia Evaluation  Patient identified by MRN, date of birth, ID band Patient awake    Reviewed: Allergy & Precautions, H&P , NPO status , Patient's Chart, lab work & pertinent test results, reviewed documented beta blocker date and time   Airway Mallampati: II  TM Distance: >3 FB Neck ROM: full    Dental no notable dental hx.    Pulmonary neg pulmonary ROS, former smoker   Pulmonary exam normal breath sounds clear to auscultation       Cardiovascular Exercise Tolerance: Good hypertension, negative cardio ROS  Rhythm:regular Rate:Normal     Neuro/Psych negative neurological ROS  negative psych ROS   GI/Hepatic negative GI ROS, Neg liver ROS,,,  Endo/Other  negative endocrine ROSdiabetes    Renal/GU negative Renal ROS  negative genitourinary   Musculoskeletal   Abdominal   Peds  Hematology negative hematology ROS (+)   Anesthesia Other Findings   Reproductive/Obstetrics negative OB ROS                             Anesthesia Physical Anesthesia Plan  ASA: 2  Anesthesia Plan: General   Post-op Pain Management:    Induction:   PONV Risk Score and Plan: Propofol infusion  Airway Management Planned:   Additional Equipment:   Intra-op Plan:   Post-operative Plan:   Informed Consent: I have reviewed the patients History and Physical, chart, labs and discussed the procedure including the risks, benefits and alternatives for the proposed anesthesia with the patient or authorized representative who has indicated his/her understanding and acceptance.     Dental Advisory Given  Plan Discussed with: CRNA  Anesthesia Plan Comments:        Anesthesia Quick Evaluation

## 2024-06-23 NOTE — Interval H&P Note (Signed)
 History and Physical Interval Note:  06/23/2024 8:09 AM  Anthony Frederick  has presented today for surgery, with the diagnosis of history of polyps.  The various methods of treatment have been discussed with the patient and family. After consideration of risks, benefits and other options for treatment, the patient has consented to  Procedures with comments: COLONOSCOPY (N/A) - 8:30 am, asa 2 as a surgical intervention.  The patient's history has been reviewed, patient examined, no change in status, stable for surgery.  I have reviewed the patient's chart and labs.  Questions were answered to the patient's satisfaction.     Carlin MARLA Hasty

## 2024-06-23 NOTE — Transfer of Care (Signed)
 Immediate Anesthesia Transfer of Care Note  Patient: Anthony Frederick  Procedure(s) Performed: COLONOSCOPY POLYPECTOMY, INTESTINE  Patient Location: PACU  Anesthesia Type:MAC  Level of Consciousness: awake and drowsy  Airway & Oxygen Therapy: Patient Spontanous Breathing  Post-op Assessment: Report given to RN, Post -op Vital signs reviewed and stable, and Patient moving all extremities X 4  Post vital signs: Reviewed and stable  Last Vitals:  Vitals Value Taken Time  BP    Temp    Pulse    Resp    SpO2      Last Pain:  Vitals:   06/23/24 0836  PainSc: 0-No pain         Complications: No notable events documented.

## 2024-06-24 LAB — SURGICAL PATHOLOGY

## 2024-06-25 ENCOUNTER — Encounter (HOSPITAL_COMMUNITY): Payer: Self-pay | Admitting: Internal Medicine

## 2024-06-26 NOTE — Anesthesia Postprocedure Evaluation (Signed)
"   Anesthesia Post Note  Patient: Anthony Frederick  Procedure(s) Performed: COLONOSCOPY POLYPECTOMY, INTESTINE  Patient location during evaluation: Phase II Anesthesia Type: MAC Level of consciousness: awake Pain management: pain level controlled Vital Signs Assessment: post-procedure vital signs reviewed and stable Respiratory status: spontaneous breathing and respiratory function stable Cardiovascular status: blood pressure returned to baseline and stable Postop Assessment: no headache and no apparent nausea or vomiting Anesthetic complications: no Comments: Late entry   No notable events documented.   Last Vitals:  Vitals:   06/23/24 0757 06/23/24 0911  BP: (!) 143/81 113/66  Pulse:  (!) 59  Resp: 20 17  Temp: 36.6 C (!) 36.4 C  SpO2: 99% 99%    Last Pain:  Vitals:   06/24/24 1358  TempSrc:   PainSc: 0-No pain                 [name] [name]      "

## 2024-07-20 ENCOUNTER — Other Ambulatory Visit (HOSPITAL_COMMUNITY): Payer: Self-pay

## 2024-07-20 DIAGNOSIS — R911 Solitary pulmonary nodule: Secondary | ICD-10-CM

## 2024-08-03 ENCOUNTER — Ambulatory Visit (HOSPITAL_COMMUNITY)

## 2024-08-12 ENCOUNTER — Ambulatory Visit (HOSPITAL_COMMUNITY): Admission: RE | Admit: 2024-08-12 | Discharge: 2024-08-12 | Disposition: A | Source: Ambulatory Visit

## 2024-08-12 DIAGNOSIS — R911 Solitary pulmonary nodule: Secondary | ICD-10-CM
